# Patient Record
Sex: Male | Born: 1960 | Race: White | Hispanic: No | Smoking: Former smoker
Health system: Southern US, Community
[De-identification: ages and names within clinical notes are randomized; demographics above are authoritative.]

## PROBLEM LIST (undated history)

## (undated) DIAGNOSIS — I4891 Unspecified atrial fibrillation: Secondary | ICD-10-CM

## (undated) DIAGNOSIS — I471 Supraventricular tachycardia, unspecified: Secondary | ICD-10-CM

## (undated) DIAGNOSIS — I456 Pre-excitation syndrome: Secondary | ICD-10-CM

## (undated) DIAGNOSIS — G4733 Obstructive sleep apnea (adult) (pediatric): Secondary | ICD-10-CM

## (undated) HISTORY — PX: OTHER SURGICAL HISTORY: SHX169

## (undated) HISTORY — PX: TONSILLECTOMY: SUR1361

---

## 1997-10-30 ENCOUNTER — Ambulatory Visit: Admission: RE | Admit: 1997-10-30 | Discharge: 1997-10-30 | Payer: Self-pay | Admitting: Endocrinology

## 1998-04-02 ENCOUNTER — Inpatient Hospital Stay: Admission: RE | Admit: 1998-04-02 | Discharge: 1998-04-04 | Payer: Self-pay | Admitting: Otolaryngology

## 2005-04-23 ENCOUNTER — Ambulatory Visit: Payer: Self-pay | Admitting: Internal Medicine

## 2005-04-23 ENCOUNTER — Encounter (INDEPENDENT_AMBULATORY_CARE_PROVIDER_SITE_OTHER): Payer: Self-pay | Admitting: Interventional Cardiology

## 2005-04-24 ENCOUNTER — Inpatient Hospital Stay (HOSPITAL_COMMUNITY): Admission: EM | Admit: 2005-04-24 | Discharge: 2005-04-28 | Payer: Self-pay | Admitting: Emergency Medicine

## 2005-06-04 ENCOUNTER — Ambulatory Visit: Payer: Self-pay | Admitting: Internal Medicine

## 2006-04-21 ENCOUNTER — Ambulatory Visit: Payer: Self-pay | Admitting: Internal Medicine

## 2006-05-17 ENCOUNTER — Ambulatory Visit: Payer: Self-pay | Admitting: Internal Medicine

## 2006-05-17 ENCOUNTER — Inpatient Hospital Stay (HOSPITAL_COMMUNITY): Admission: EM | Admit: 2006-05-17 | Discharge: 2006-05-18 | Payer: Self-pay | Admitting: Emergency Medicine

## 2006-06-13 ENCOUNTER — Ambulatory Visit: Payer: Self-pay | Admitting: Internal Medicine

## 2006-08-02 ENCOUNTER — Inpatient Hospital Stay (HOSPITAL_COMMUNITY): Admission: EM | Admit: 2006-08-02 | Discharge: 2006-08-03 | Payer: Self-pay | Admitting: Emergency Medicine

## 2006-08-02 ENCOUNTER — Ambulatory Visit: Payer: Self-pay | Admitting: Internal Medicine

## 2007-01-12 IMAGING — CT CT HEART WO/W CTA ONLY W/ CA
1 of 2 series · 5 of 20 positions shown, 7 images · IV contrast (APPLIED)
Comparison: none

CLINICAL DATA: Mr. Virgelina is a 44-year-old patient of Dr. Fuseini Mohammed Galley, who presented to the hospital with a wide complex tachycardia and atrial fibrillation.  EP study is suggested.  An accessory tract accessible near the coronary sinus.  However, after multiple ablation attempts, the arrhythmia was not able to be eradicated.  Dr. Bambucafe wanted cardiac CT to further define coronary sinus anatomy. 
CTA CORONARY WITH CALCIUM SCORE:
PROTOCOL: The patient was scanned using a Siemens Sensation 64-slice scanner.  Gantry rotation speed was 330 milliseconds.  Due to patient size and heart rate, we elected to collimate at 1 mm with .5-mm overlap.  The patient was given p.o. beta blockers on the floor.  He was recently cardioverted with Rythmol.  The average heart rate during the case was 74 beats/minute.  
After initial topogram, a 3x3-mm calcium score was performed without contrast.  The patient then received 20 cc of contrast for timing bolus.  We elected to add 5 seconds to the peak aortic density curve to allow extra time for the coronary sinus to fill. 
Subsequently, 100 cc of contrast was used for the coronary sinus and CT angiogram.

[Series 9: abd 3s · axial · 0.86mm/px · z∈[-300,-192]mm · 5 of 55 slices shown, 7 images]
[im 10/55  vessel]
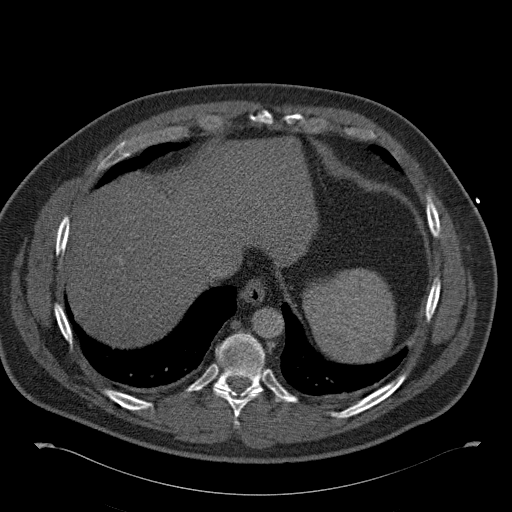
[im 10/55  lung]
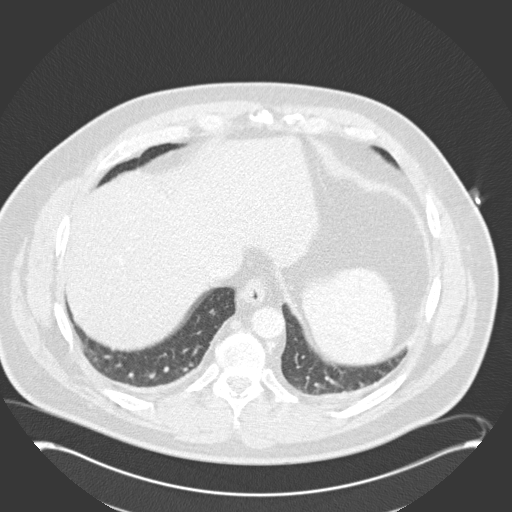
[im 19/55  vessel]
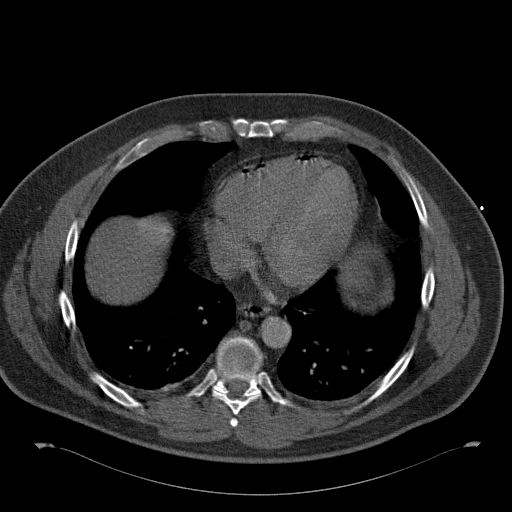
[im 28/55  vessel]
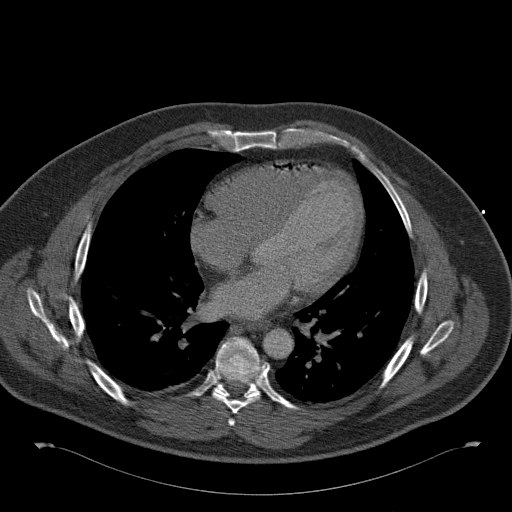
[im 37/55  vessel]
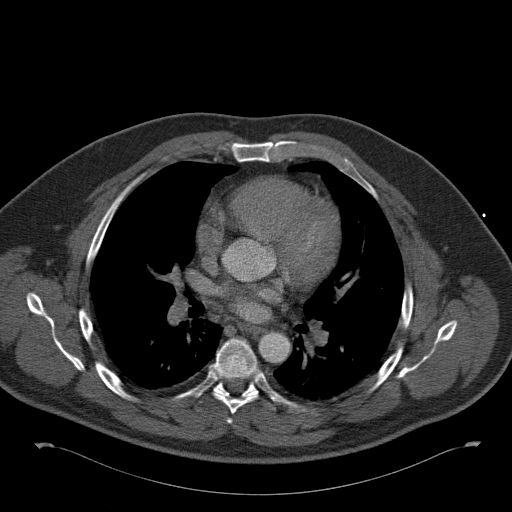
[im 46/55  vessel]
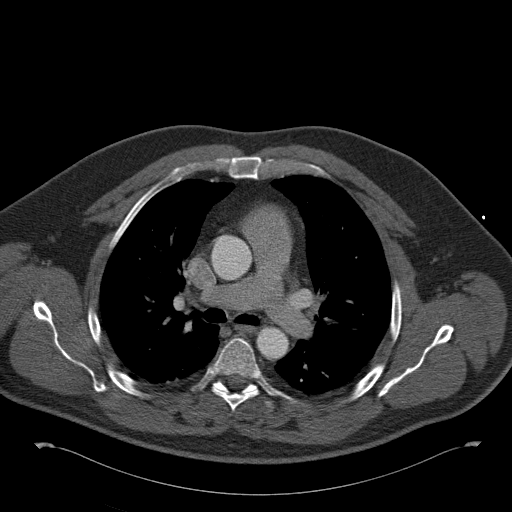
[im 46/55  lung]
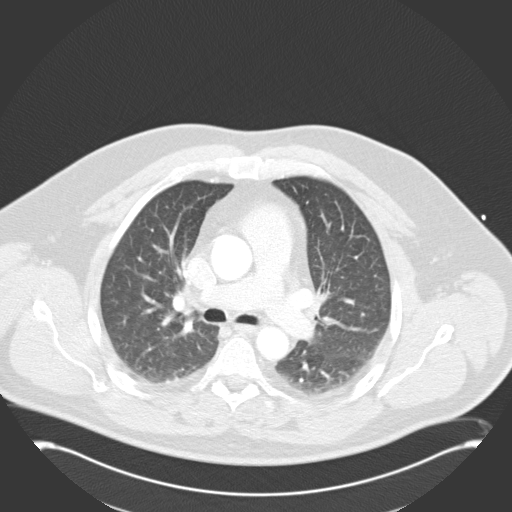

[5 of 20 positions shown; findings below may reference images not displayed]

FINDINGS: The coronary sinus arose from its normal position just superior to the septal leaflet of the tricuspid valve.  There was a large middle (posterior intraventricular vein), which took off from the coronary sinus right at its ostium.  It made approximately a 95-degree angle from the main body of the coronary sinus.  The great cardiac vein then proceeded in its normal course in the atrioventricular sulcus.  There was one large posteromedial vein seen.  The great cardiac vein then proceeded anteriorly into a large anterolateral vein, which ran side by side the LAD and actually crossed it for a small time. 
Of note, there was no prominent thebesian valve, and there was no evidence of the diverticula or any other abnormal structure emanating from the coronary sinus. 
The patient?s calcium score was 0.
Visually, LV function was normal.  We had difficulty quantitating the ejection fraction due to poor opacification of the RV relative to the LV.
The patient?s left atrium and right-sided cardiac chambers were also of normal size.  There was a trileaflet aortic valve, and the pulmonary veins arose normally from the posterior aspect of the left atrium.  There was no pericardial effusion, and the surrounding pericardiac structures appeared normal. 
Coronary CTA again was somewhat suboptimal.  Due to the timing, we chose to optimize the coronary sinus.
The left main coronary artery was normal. 
The left anterior descending artery was normal.  There were two small diagonal branches and an intermediate branch.  The circumflex coronary artery was difficult to see due to the prominence of the coronary sinus; however, it appeared small, primarily consisting of a small AV groove branch. 
The right coronary artery was dominant and normal with a simple PDA and posterolateral branch. 
Evaluation of extra-cardiac structures demonstrates mildly prominent mediastinal and right hilar nodal tissue.  Neither station is pathologic by CT size criteria, and findings are most likely reactive.  There is lingular atelectasis or scar.
IMPRESSION: 1.  Normal anatomy to the coronary sinus.  Large middle or posterior intraventricular vein coming off at the ostium of the coronary sinus at a 95-degree angle, most likely unable to be selectively cannulated at the time of EP study.  Large posteromedial vein and large anterolateral vein also emanating normally from the great cardiac vein. 
2.  No abnormal coronary sinus pathology or diverticula.  The thebesian vein is not particularly prominent. 
3.  Normal left ventricular cavity size and function. 
4.  Calcium score of 0. 
5.  No apparent coronary artery anomalies.  Circumflex coronary artery somewhat suboptimally seen due to prominence of coronary sinus visualization.
6.  The patient tolerated the procedure well. 
The study should be billed by [facility] as 5396-T and 015-T codes, done in conjunction with Dr. Telmo Gilpin from [REDACTED].

## 2010-05-10 ENCOUNTER — Encounter: Payer: Self-pay | Admitting: Interventional Cardiology

## 2010-09-04 NOTE — H&P (Signed)
NAMEJYDEN, [name]            ACCOUNT NO.:  [id number]   MEDICAL RECORD NO.:  [id number]          PATIENT TYPE:  EMS   LOCATION:  MAJO                         FACILITY:  [facility]   PHYSICIAN:  Francisca December, M.D.  DATE OF BIRTH:  April 18, 1961   DATE OF ADMISSION:  04/23/2005  DATE OF DISCHARGE:                                HISTORY & PHYSICAL   `   HISTORY OF PRESENT ILLNESS:  Sean [name] is a 50 year old white man  who is admitted to [facility] with a new onset of atrial  fibrillation with a rapid ventricular rate.   The patient, who has no past history of cardiac disease, was awakened during  the night with a sensation of a rapid and irregular heart beat.  There was  no associated chest pain, tightness, heaviness, pressure, or squeezing, nor  was there any dizziness, lightheadedness, near-syncope or dyspnea.  He  presented to the emergency department, where he was found to be in atrial  fibrillation with a ventricular rate of 120-130 beats per minute.  Treatment  was initiated with intravenous diltiazem and intravenous metoprolol.   As noted, the patient has no past history of cardiac disease, including no  history of chest pain, myocardial infarction, coronary artery disease,  congestive heart failure, or arrhythmia.  He has no risk factors for  coronary artery disease including no history of hypertension, dyslipidemia,  diabetes mellitus, smoking, or family history of early coronary artery  disease.  Both of his parents, however, have atrial fibrillation.   The patient has no other medical problems.  He has lost approximately 20  pounds recently after embarking on an exercise program using an elliptical  __________.   MEDICATIONS:  None.   ALLERGIES:  None.   OPERATIONS:  Surgery for sleep apnea.   SOCIAL HISTORY:  The patient is a Naval architect.  He is single.  He neither  smokes cigarettes nor drinks alcohol.   FAMILY HISTORY:  As described  above.   REVIEW OF SYSTEMS:  Review of systems reveals no new problems related to his  head, eyes, ears, nose, mouth, throat, lungs, gastrointestinal system,  genitourinary system, or extremities.  There is no history of neurologic or  psychiatric disorder.  There is no history of fever, chills or weight loss.   PHYSICAL EXAMINATION:  VITAL SIGNS:  Blood pressure 96/64.  Pulse 130 and  irregularly irregular.  Respirations 20.  Temperature 98.2.  GENERAL:  The patient was an obese, middle-aged white man in no discomfort.  He was alert, oriented, appropriate, and responsive.  HEENT:  Head, eyes, nose, and mouth were normal.  NECK:  The neck was without thyromegaly or adenopathy.  Carotid pulses were  palpable bilaterally and without bruits.  CARDIAC:  Examination revealed an irregularly irregular rhythm.  There was  no murmur, rub, gallop or click.  No chest wall tenderness was noted.  LUNGS:  The lungs were clear.  ABDOMEN:  The abdomen was soft and nontender.  There was no mass,  hepatosplenomegaly, bruit, distention, rebound, guarding, or rigidity.  Bowel sounds were normal.  RECTAL AND GENITAL:  Examinations were not performed as they were not  pertinent to the reason for acute care hospitalization.  EXTREMITIES:  The extremities were without edema, deviation, or deformity.  Radial and dorsalis pedal pulses were palpable bilaterally.  NEUROLOGIC:  Brief screening neurologic survey was unremarkable.   LABORATORY AND ACCESSORY CLINICAL DATA:  The electrocardiogram demonstrates  evidence of atrial fibrillation with a rapid ventricular rate (133 beats per  minute) and aberrantly conducted complexes.  The chest radiograph had not  yet been performed at the time of this dictation.  The initial set of  cardiac markers revealed a myoglobin of 45.3, CK-MB less than 1.0 and  troponin less than 0.05.  Potassium was 4.1, BUN 20, and creatinine 1.1.  White count was 10.1 with a hemoglobin of  14.6 and hematocrit of 42.3.  The  remaining studies were pending at the time of this dictation.   IMPRESSION:  New-onset atrial fibrillation with rapid ventricular rate.   PLAN:  1.  Telemetry.  2.  Serial cardiac enzymes.  3.  Aspirin.  4.  Intravenous heparin.  5.  Intravenous diltiazem.  6.  Echocardiogram.  7.  Thyroid function tests.  8.  Fasting lipid profile.  9.  Further measures per Dr. Amil Amen.      [name], MD  Electronically Signed      Francisca December, M.D.  Electronically Signed    MSC/MEDQ  D:  04/23/2005  T:  04/23/2005  Job:  [id number]

## 2010-09-04 NOTE — Discharge Summary (Signed)
NAMEREINHART, SAULTERS            ACCOUNT NO.:  [id number]   MEDICAL RECORD NO.:  [id number]          PATIENT TYPE:  INP   LOCATION:  6526                         FACILITY:  [facility]   PHYSICIAN:  Rollene Rotunda, MD, FACCDATE OF BIRTH:  [birth year]   DATE OF ADMISSION:  08/02/2006  DATE OF DISCHARGE:  08/03/2006                               DISCHARGE SUMMARY   ALLERGIES:  The patient has no known drug allergies.   TELEPHONE NUMBER:  [phone].   COMMENT:  This dictation less than 25 minutes.   PRINCIPAL DIAGNOSES:  1. Admitted with recurrent atrial fibrillation/rapid ventricular rate      (in 105s ).      a.     Largely asymptomatic with no chest pain, no dyspnea, no       palpitations.  2. History of atrial fibrillation.      a.     This is the third outbreak of his atrial fibrillation.      b.     Prior 2 outbreaks broke under intravenous procainamide.      c.     Resistance intravenous procainamide this admission, probably       secondary to slow 4-mg-per-minute administration rate (the patient       needs 17-mg-per-minute administration rate to be effective).  3. 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  4. Electrophysiology study, April 28, 2006, multiple ablation was      placed with finding of left epicardial posteroseptal accessory      pathway.  5. Referral to [place]/Dr. Fitzgerald/pulmonary vein procedure      contemplated.   SECONDARY DIAGNOSES:  Obstructive sleep apnea, status post oropharyngeal  plasty surgery.   PROCEDURE:  August 03, 2006, direct-current cardioversion under  anesthesia support, Dr. Lewayne Bunting.  The patient converted from fast  atrial fibrillation to sinus rhythm with a 200-joules biphasic shock,  Dr. Lewayne Bunting.   BRIEF HISTORY:  Mr. 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  He woke up at  2:30 the morning of  April 15 with his heart racing.  He knows when he  returns to a recurrent atrial fibrillation.   He drove himself to the emergency room.  His heart rate on EKG was 150  beats per minute.  He is fairly asymptomatic, no chest pain, no dyspnea,  no palpitation, no diaphoresis.   He has had 2 episodes prior to this spaced a little over 1 year apart.  Now this one was follows his last admission in late January 2008 by 3  months.   This dysrhythmia has proven resistant to flecainide, resistant to IV  metoprolol.  The patient had an electrophysiology study and attempted  radiofrequency catheter ablation of atrial fibrillation, April 28, 2005 with the finding of left epicardial posteroseptal accessory  pathway.  On admission, with recurrence May 17, 2006, he converted  to sinus rhythm on IV procainamide.  He is currently receiving 1 g of IV  procainamide.  He has been in a stable rhythm  since his last  hospitalization, January 2008.  He saw Dr. Ladona Ridgel in the office,  June 13, 2006.  The plan is if recurrence, a repeat attempted  ablation might be indicated; the patient says, I would like to get  fixed or die trying.   It is also possible the patient would be referred for ablation attempt  at another institution.   HOSPITAL COURSE:  The patient presented August 02, 2006 after waking in  atrial fibrillation.  He says he can tell when he has returned to his  irregular rhythm.  He came to the emergency room and on EKG was found to  be in atrial fibrillation with rapid ventricular rate with heart rate in  the 150s.  He was started on IV procainamide at 2 mg per minute; this  was subsequently increased to 4 mg per minute; however, he did not  convert after 1 gram of IV procainamide.  He has been additionally given  IV metoprolol and oral metoprolol in an effort to keep his heart rate at  least in the 120s.  He has been semi-successful at this overnight with  procainamide continued to  be running at 2 mg per minute.  He was seen by  Dr. Graciela Husbands, who recommended DCCV if the patient had not converted  spontaneously by the morning of April 16.  The patient had not converted  spontaneously and so cardioversion was done at about 4 o'clock by Dr.  Lewayne Bunting.  The patient successfully converted to sinus rhythm.  He  goes home on no medications except for a baby aspirin with followup with  Dr. Ladona Ridgel, who will arrange referral with Dr. Sampson Goon at [facility].  The patient's phone number will be faxed to the office so that  Dr. Lubertha Basque office can call the patient regarding future plans.  The  patient's telephone number is area code [phone].   LABORATORY STUDIES THIS ADMISSION:  Hemoglobin is 15.9, hematocrit 45.7,  white cell count 10.6, platelets of 216,000.  Creatinine is 1.2.  The  patient had been started on heparin this admission and was 0.53 on the  morning of April 16.  Troponin I studies were 0.01 then 0.01 and a TSH  was obtained; it was 1.454.      Maple Mirza, PA      Rollene Rotunda, MD, [facility]  Electronically Signed    GM/MEDQ  D:  08/03/2006  T:  08/04/2006  Job:  [id number]   cc:   Doylene Canning. Ladona Ridgel, MD

## 2010-09-04 NOTE — Op Note (Signed)
NAMEJERL, MUNYAN            ACCOUNT NO.:  [id number]   MEDICAL RECORD NO.:  [id number]          PATIENT TYPE:  INP   LOCATION:  2905                         FACILITY:  [facility]   PHYSICIAN:  Doylene Canning. Ladona Ridgel, M.D.  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   HISTORY OF PRESENT ILLNESS:  The patient is a 50 year old man with no  history of tachy palpitations who was admitted to the hospital with atrial  fibrillation and rapid ventricular response. He has intermittent pre-  excitation during A fib with the closest couple pre-excite interval of  approximately 230-240 milliseconds. He is now referred for  electrophysiologic study and possible catheter ablation.   PROCEDURE:  After informed consent was obtained, the patient taken to the  diagnostic EP lab in fasting state. After usual preparation and draping, 6-  French flexible catheter was inserted percutaneously into the right jugular  vein advanced to coronary sinus. A 5-French quadripolar catheter was  inserted percutaneously in the right femoral vein and advanced the His  bundle. Rapid ventricular pacing was carried out from the RV apex at paced  cycle length of 500 milliseconds and stepwise decreased down 310  milliseconds where the retrograde pathway ERP was observed. During rapid  ventricular pacemaker activation was eccentric and non decremental. The  earliest atrial activation was in the region near the os of the coronary  sinus. Next programmed ventricular stimulation was carried out from the RV  apex at base drive cycle length of 952 milliseconds. The S1-S2 interval  stepwise decreased down to 300 milliseconds where the retrograde AV node ERP  was observed. During  programmed ventricular stimulation the atrial  activation was non decremental and eccentric. Next rapid atrial pacing was  carried out from the coronary sinus as well as from the right atrium at  paced cycle lengths of 500 milliseconds and stepwise decreased down to 320  milliseconds where AV Wenckebach was observed. During rapid during rapid  atrial pacing, the patient was pre-excited down to 320 milliseconds where  the antegrade pathway ERP was observed. Additional pacing resulted in the  initiation of SVT (orthodromic AVRT).  Next programmed atrial stimulation  was carried out from the coronary sinus in high right atrium base drive  cycle length 841 milliseconds.  The S1-S2 interval was stepwise decreased  down to 340 milliseconds where the AV node ERP was observed. During  programmed atrial stimulation there were no AH jumps nor were there echo  beats. On the beat prior to the ERP at the 500/350 antegrade pathway ERP was  observed. Next additional rapid atrial pacing was carried out with result in  initiation of the patient's SVT. This was AV reentry tachycardia with  eccentric atrial activation the earliest of which was in the os of his  coronary sinus region to region at approximately 6 o'clock on the mitral  valve annulus. The  tachycardia would terminate spontaneously. PVCs could not  be placed secondary to spontaneous termination of the tachycardia. 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 Of note, the tachycardia typically terminated with a block in the  AV node. At this point a 7-French quadripolar ablation catheter was inserted  into the right femoral vein and mapping of the right posteroseptal space was  carried out. Several RF energy applications were delivered here and the VA  time was quite short. This was carried out during ventricular pacing.   Unfortunately the tachycardia did not terminate. Next, mapping was  subsequently carried out in the mid anteroseptal and the [place] time actually  had prolonged. Several RF energy applications were given right on the roof  of the CS on the anterior lip of the CS and the area just anterior to the CS  between the tricuspid valve annulus and in fact this will region was ablated  but there is no termination or resolution of accessory pathway conduction.  At this point the ablation catheter was maneuvered into the coronary sinus  and mapping was carried out in the coronary sinus. Several RF energy  applications were also delivered in the os of the coronary sinus and out to  approximately 5 o'clock on the mitral valve annulus. Unfortunately, however,  there is no effect on accessory pathway conduction. At this point the right  femoral artery was punctured and a 7-French quadripolar catheter was  inserted into the right femoral artery and advanced into the left ventricle  retrograde across the aortic valve and mapping of the left posteroseptal  space was carried out.  Again very early [place] times were found during mapping  with ventricular pacing and RF energy application applied to the left  posteroseptal space along the atrial ventricular insertions of the annulus  were carried out but again there was no termination of the patient's  tachycardia. At this point venography of the coronary sinus was carried out  utilizing a standard 6-French sheath and an angioplasty guidewire. This  demonstrated a paucity of vein. There was a nice posterolateral vein but no  diverticulum was seen. The middle cardiac vein was not visualized as well.  This raised the question of perhaps it having a separate os but this was not  ever cannulated. At this point the ablation catheter was repositioned within  the coronary sinus and mapping and ablating carried out at multiple sites along the coronary sinus but there was no  resolution of accessory pathway  conduction. At this point after over four hours of procedural time the  procedure was terminated, the catheters were removed, hemostasis was assured  and the patient was returned to his room in satisfactory condition.   COMPLICATIONS:  There were no immediate procedure complications.   RESULTS:  A. Baseline ECG. The baseline ECG demonstrates sinus rhythm with a  sinus node cycle length of 1051 milliseconds.  PR interval was 90  milliseconds. QRS duration 100 milliseconds.  The HV interval was 30  milliseconds. During pacing from the coronary sinus the HV interval  essentially was 0.  Actually the HV interval was less than 0.  C. Rapid ventricular pacing. Rapid ventricular pacing was carried out the RV  apex demonstrating a VA Wenckebach cycle length of 310 milliseconds. During  rapid ventricular pacing, the atrial activation was eccentric and non  decremental.  D.  Programmed ventricular  stimulation. Programmed ventricular stimulation  was carried out the RV apex at base drive cycle length of 161 milliseconds.  The S1-S2 interval was stepwise decreased down to 300 milliseconds where the  retrograde AV node ERP was observed. During programmed ventricular  stimulation the atrial activation was eccentric and nondecremental.  E.  Rapid atrial pacing. Rapid pacing was carried out the coronary sinus in  the high right atrium.  Pace cycle length of 600 milliseconds was stepwise  decreased down to 320 milliseconds where AV Wenckebach was observed. During  rapid atrial pacing, the PR interval actually shortened with marked manifest  pre-excitation down to 320 milliseconds where the QRS narrowed, antegrade  pathway blocked and tachycardia ensued.  F. Programmed atrial stimulation. Programmed atrial stimulation was carried  out from the coronary sinus. The high right atrium at base drive cycle  length of 096 milliseconds. The S1-S2 interval was stepwise decreased  from  440 milliseconds down to 340 milliseconds with the AV node ERP was observed.  During programmed atrial stimulation there was no inducible SVT.  G.  Arrhythmias observed.  1.  AV reentrant tachycardia, initiation rapid atrial pacing, duration      nonsustained, termination was spontaneous, cycle length 328      milliseconds.      1.  Mapping. Mapping of the patient's tachycardia demonstrated the          earliest atrial activation in the right posteroseptal space as well          as the coronary sinus os and out to a region at approximately 5          o'clock on the coronary sinus os. Of note during tachycardia in          during rapid ventricular pacing there were multiple early sites          throughout this posteroseptal space region.          1.  RF energy application. A total of 18 RF energy applications were              delivered both to the endocardial insertion (right posteroseptal             space and left posteroseptal space as well as the epicardial              region within the coronary sinus. The middle cardiac vein could              not be cannulated and was not visualized with venography in this              particular patient. This raises the question of a separate os in              the middle cardiac vein. RF energy application, both within the              coronary sinus and in the right posteroseptal space near the              coronary sinus anterior lip as well as on the left posteroseptal              space along both the atrial and ventricular insertions of the              mitral annulus failed to terminate accessory pathway conduction.   RECOMMENDATIONS:  The patient will be treated with Rythmol initially and  allow time for reconsideration for additional catheter ablation.  ______________________________  Doylene Canning. Ladona Ridgel, M.D.     GWT/MEDQ  D:  04/28/2005  T:  04/28/2005  Job:  [id number]   cc:   Francisca December, M.D.  Fax: [phone]    Lyn Records, M.D.  Fax: [phone]

## 2010-09-04 NOTE — Discharge Summary (Signed)
Sean [name], [name]            ACCOUNT NO.:  [id number]   MEDICAL RECORD NO.:  [id number]          PATIENT TYPE:  INP   LOCATION:  6524                         FACILITY:  [facility]   PHYSICIAN:  Doylene Canning. Ladona Ridgel, MD    DATE OF BIRTH:  1960/10/11   DATE OF ADMISSION:  05/17/2006  DATE OF DISCHARGE:  05/18/2006                               DISCHARGE SUMMARY   NO KNOWN DRUG ALLERGIES.   PRINCIPAL DIAGNOSES:  1. Admitted with atrial fibrillation and rapid ventricular rate.      a.     Resistant to flecainide 200 mg.      b.     Resistant to intravenous metoprolol 5 mg times 2 given in       ED.      c.     Converted to atrial flutter after 500 mg intravenous       procainamide.      d.     Converted to sinus rhythm after 1 gram intravenous of       procainamide.      e.     On Lopressor 25 mg p.o. overnight.  2. Patient maintaining sinus rhythm after conversion with      procainamide.   SECONDARY DIAGNOSES:  1. Wolff-Parkinson-White with existence of posteroseptal epicardial      accessory pathway.  2. Status post attempted electrophysiology, attempted radiofrequency      catheter ablation April 28, 2005, with finding of left epicardial      posteroseptal accessory pathway.  3. Obstructive sleep apnea, status post oropharyngeal release surgery.   BRIEF HISTORY:  Sean [name] is a 50 year old male.  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  He woke up the  morning of January 29 feeling not right.  He denies chest pain,  dizziness, dyspnea or fatigue.  He went into his rec room where he keeps  his exercise equipment.  He took his heart rate, which was 140-150 beats  per minute.  He took a rescue flecainide 100 mg orally at about 5:45  that morning, it had no effect and he came to the emergency room.  Electrocardiogram in the ED showed narrow complex irregular tachycardia  consistent with atrial fibrillation.   Oddly enough, Sean [name] presented in  rapid atrial fibrillation just 1  year ago, January 2007.  He converted to sinus rhythm on IV  procainamide.  Electrocardiograms after conversion demonstrated short  PR, delta waves in leads I, III,  AVL.  He underwent a long 4-hour  electrophysiology study with attempted radiofrequency catheter ablation  April 28, 2005.  Ablations were carried out in both the right and left  posteroseptal spaces as well as sites in the coronary sinus.  In each  case, there was no termination of his tachycardia.  Conclusion was that  the accessory pathway was a left epicardial posteroseptal accessory  pathway.   The patient has done well actually since his discharge on May 15, 2005.  His last office visit was actually April 21, 2006, this year,  with Dr. Ladona Ridgel.  At that  time, his supraventricular tachycardia was  felt to be stable and there was no impediments to his exercise  tolerance.  Patient will be admitted today.  The plan will be to start  the patient on another dose of flecainide and see if he converts; if  not, he will be given Lopressor 5 mg IV with a repeat dose if heart rate  does not respond in 1 hour.   HOSPITAL COURSE:  The patient presented May 17, 2006, with atrial  fibrillation, rapid ventricular rate.  It did not respond to his initial  flecainide taken at home, 100 mg.  He was given another 100 mg in the  emergency room with no response, heart rates maintaining in 140s-150s.  He received 2 doses of IV metoprolol 5 mg times 2, and heart rate had  showed no appreciable change in his rate.  He was then started on IV  procainamide drip 1 gram over 1 hour.  After 500 mg, his irregular  rhythm had regularized to a 150-beat per minute atrial flutter.  After 1  gram was administered, the patient actually suddenly converted to a  sinus rhythm with a rate in the 70s.  Procainamide drip, which had been  planned at 2 mg per minute, was discontinued and the patient was  maintained  on Lopressor 25 mg overnight.  Patient has maintained sinus  rhythm in the next 16 hours after conversion and we will discharge the  morning of January 30.  He has actually been on heparin this  hospitalization, which has been discontinued.  He is achieving 96%  oxygen saturation on room air.  His temperature is 98.7.  He has had no  complaints and is in sinus rhythm.   Complete blood count:  White cells 9.6, hemoglobin 15.1, hematocrit  44.4, platelets 198.  Other studies were taken in the emergency room.  Serum electrolytes on admission:  Sodium 137, potassium 4.3, chloride  104, carbonate 24, BUN is 14, creatinine 0.96, and glucose 102.  Pro  time was 13.1, INR was 1.0, troponin I studies x1 was 0.02.  His TSH  this admission was 1.473.  This patient will go home on Toprol XL 25 mg  daily.  He has flecainide, which he carries with him to take as rescue  if rapid rhythm returns.  He is to follow up with [facility]  [address] to see Dr. Ladona Ridgel Monday, June 13, 2006, at  2:45 p.m.  At that time, discussion will turn toward a second attempt at  radiofrequency ablation, either at [place] or in [place].      Maple Mirza, PA      Doylene Canning. Ladona Ridgel, MD  Electronically Signed    GM/MEDQ  D:  05/18/2006  T:  05/18/2006  Job:  [id number]

## 2010-09-04 NOTE — Consult Note (Signed)
Sean [name], Sean [name]            ACCOUNT NO.:  [id number]   MEDICAL RECORD NO.:  [id number]          PATIENT TYPE:  OBV   LOCATION:  3702                         FACILITY:  [facility]   PHYSICIAN:  Duke Salvia, M.D.  DATE OF BIRTH:  12/23/1960   DATE OF CONSULTATION:  04/23/2005  DATE OF DISCHARGE:                                   CONSULTATION   Thank you very much for asking [place] to see Sean [name] in  electrophysiological consultation for atrial fibrillation with wide complex  with possible pre-excitation.   Sean [name] is a 50 year old recently divorced gentleman who has  obstructive sleep apnea status UPPP and nasal septoplasty who was awakened  on the night of January 4 - January 5 with a startle and sensation that his  heart was not quite right.  He hooked himself up to the elliptical that he  had bought to get in shape after he separated from his wife and noted that  his heart rate was 140 to 150.  Retrospectively, he had noted that his heart  rate was in the normal range earlier on the a.m. of January 4 at a rate of  about 70.  Because of his palpitations and some associated discomfort in his  chest, he presented to the emergency room where he was found to be in an  irregular wide complex rhythm that was felt to be atrial fibrillation with  aberration.  He was treated with beta blockers, Cardizem, and heparin, and  admitted.   This morning after review, Dr. Katrinka Blazing astutely appreciated that, in fact, the  electrocardiogram might represent pre-excitation.  The patient has not had a  prior history of tachy palpitations; he has had no history of syncope.   His past medical history is notable for questionable:  1.  Borderline hypertension.  2.  Obstructive sleep apnea status post surgery as noted previously.  3.  Obesity with a weight of about 295 to 305 pounds.   His past surgical history is as noted previously.   SOCIAL HISTORY:  He is divorced, I don't recall  whether he has children.  He  is an over the road truck driver.  He uses caffeine.  He denies the use of  cigarettes, alcohol, or recreational drugs.   CURRENT MEDICATIONS:  Heparin, aspirin, Cardizem, Lopressor x 1 single dose,  and now Amiodarone.   ALLERGIES:  He has no known drug allergies.   PHYSICAL EXAMINATION:  GENERAL: Obese Caucasian male appearing his stated age of 50.  VITAL SIGNS:  Blood pressure 112/86, pulse 110 to 130, weight 312 pounds,  respirations 20.  HEENT:  No icterus or xanthoma.  The neck veins were flat.  The carotids  were brisk and full bilaterally without bruits.  BACK:  Without kyphosis or scoliosis.  LUNGS:  Clear.  HEART:  Irregular without murmurs or gallops.  ABDOMEN:  Soft, with active bowel sounds.  EXTREMITIES:  Femoral pulses were 1+, distal pulses were intact, there is no  cyanosis, clubbing, and edema.  NEUROLOGICAL:  Grossly normal.   Electrocardiogram dated this morning demonstrated atrial fibrillation with a  rate of 150 beats per minute.  There are wide complex beats that are not  long-short related.  The negative delta waves are noted in leads 2, 3, and F  with a transition to positive in lead V1 and V2, consistent with a posterior  reciprocal pathway likely within the coronary sinus based on the [place]  observations.   IMPRESSION:  1.  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  2.  Not low risk for sudden cardiac death given an RR coupling interval of      230 milliseconds.  3.  History of sleep apnea status post UPPP with no further symptoms.  4.  Borderline blood pressure.  5.  Obesity.   DISCUSSION:  [name] has pre-excited atrial fibrillation and is not at  low risk for sudden cardiac death based on the close couplings of his RR  intervals.  We have discussed treatment options, all of which are predicated  on getting him back into sinus rhythm.  Given the onset of his episode  on  the evening of April 22, 2005, and having a known heart rate on the morning  of January 4, I would like to get him cardioverted ASAP and this is  scheduled tomorrow morning at 8 a.m.  I have reviewed this with the  anesthesiologist and Dr. Armanda Magic.   Once he is in sinus rhythm, his anticoagulation can be discontinued and EP  study and RF catheter ablation is available.  We have discussed treatment  options including beta blockers, drug therapy, and EP study and RF catheter  ablation.  We have discussed the potential benefits as well as the potential  risks including but not limited to death, perforation, myocardial  infarction, and stroke.  He understands these risks.  He understands that  the risks may be somewhat greater than this, also, maybe by a factor of 2 or  3 based on the presence of the pathway moving through the coronary sinus and  also because of the projected location of the pathway that I don't want him  on anticoagulation at the time of the procedure.  Based on the above and  based on his risks of sudden death related to his pre-excited atrial  fibrillation, he would like to proceed with the procedure with the hopes of  reducing his overall risks as well as minimizing the likelihood of  medications and recurrences.  It would also be quite important, I suspect,  for his DOT eligibility.   Based on the above, we will therefore:  1.  Proceed with cardioversion in the morning.  2.  Anticipate RF catheter ablation and EP testing on Monday.   Thank you for the consultation.           ______________________________  Duke Salvia, M.D.     SCK/MEDQ  D:  04/23/2005  T:  04/23/2005  Job:  [id number]   cc:   Lyn Records, M.D.  Fax: [phone]

## 2010-09-04 NOTE — Op Note (Signed)
[name], Sean            ACCOUNT NO.:  [id number]   MEDICAL RECORD NO.:  [id number]          PATIENT TYPE:  INP   LOCATION:  6526                         FACILITY:  [facility]   PHYSICIAN:  Doylene Canning. Ladona Ridgel, MD    DATE OF BIRTH:  Jun 22, 1960   DATE OF PROCEDURE:  08/03/2006  DATE OF DISCHARGE:  08/03/2006                               OPERATIVE REPORT   PROCEDURE PERFORMED:  DC cardioversion.   INDICATIONS:  Atrial fibrillation with rapid ventricular response and  WPW syndrome.   The patient is a very pleasant 50 year old male with history of atrial  fibrillation and WPW syndrome and a posterolateral accessory pathway  (epicardial) who underwent attempted ablation over a year ago.  He has  had two episodes of atrial fibrillation since then with rapid  ventricular response.  He came to the hospital yesterday having gone  back in atrial fibrillation and was treated with IV procainamide.  He is  now referred for DC cardioversion.   PROCEDURE:  After informed obtained, the patient taken diagnostic EP lab  in the fasting state.  The patient was prepped in the usual manner.  He  was sedated under the direction anesthesia with sodium Pentothal.  He  was cardioverted with 200 joules of synchronized biphasic energy through  the electrode dispersement pads which were in the anterior-posterior  position.  Was subsequently restored sinus rhythm.  The patient  tolerated procedure well.  There were no immediate procedure  complications.   RESULTS:  This demonstrate successful DC cardioversion of a patient with  atrial fibrillation and rapid ventricular response.      Doylene Canning. Ladona Ridgel, MD  Electronically Signed     GWT/MEDQ  D:  08/03/2006  T:  08/04/2006  Job:  [id number]   cc:   Lyn Records, M.D.

## 2010-09-04 NOTE — Discharge Summary (Signed)
Sean [name], Sean [name]            ACCOUNT NO.:  [id number]   MEDICAL RECORD NO.:  [id number]          PATIENT TYPE:  INP   LOCATION:  2905                         FACILITY:  [facility]   PHYSICIAN:  Lyn Records, M.D.   DATE OF BIRTH:  [birth year]   DATE OF ADMISSION:  04/23/2005  DATE OF DISCHARGE:                                 DISCHARGE SUMMARY   REASON FOR ADMISSION:  Atrial fibrillation with rapid ventricular response.   DISCHARGE DIAGNOSES:  1.  Atrial fibrillation, resolved on intravenous procainamide.  2.  Wolff-Parkinson-White syndrome with antegrade conduction down the      accessory pathway.      1.  Initial electrophysiologic study demonstrate accessory pathway, but          it cannot be ablated.  3.  Borderline blood pressure elevation.   RECOMMENDATIONS:  1.  The patient will follow up with Dr. Ladona Ridgel concerning possible repeat EP      study with attempt at ablation.  2.  CT angio was performed on the day of discharge to help define the      coronary venous system with hopes that this will make it easy to perform      the ablation procedure in several weeks to a month or so.   MEDICATIONS AT DISCHARGE:  1.  Rythmol 225 mg p.o. b.i.d.  2.  Aspirin 325 mg p.o. daily.   PHYSICAL ACTIVITY:  No limitations.   RETURN TO WORK:  April 30, 2005.   FOLLOWUP:  The patient has a follow-up appointment set to see Dr. Ladona Ridgel  within the next two to three weeks. The patient has no follow-up appointment  with Dr. Katrinka Blazing and I will be seeing the patient after he has been through  his electrophysiologic therapy.   PROCEDURE PERFORMED:  1.  Electrophysiologic testing and attempted ablation on April 27, 2005, by      Dr. Lewayne Bunting.  2.  Coronary CTA and coronary venous evaluation on April 28, 2005.   CONDITION ON DISCHARGE:  Improved.   HOSPITAL COURSE AND COMMENTS:  The patient was admitted through the  emergency room after suddenly developing tachypalpitations on  April 23, 2005. He had been given IV beta blocker and IV Cardizem in the emergency  room without much reduction in heart rate and in fact as Cardizem therapy  was intensified he began having salvos of wide complex tachycardia at rates  near 200. The complexes appear to have a pre-excitation type of morphology.  Because of this clinical scenario I asked the EP service to see the patient  with concern being that an accessory pathway might be present.   Dr. Graciela Husbands saw the patient on the day of my request and instead of IV  amiodarone as I had ordered he actually gave the patient IV procainamide and  there was resolution of the arrhythmia and the post conversion EKG  subsequently revealed a pre-excitation electrocardiogram.   He ultimately was taken to the EP lab on April 27, 2005.  Please see  details of the report by Dr. Ladona Ridgel. They were unsuccessful at ablating  the  accessory pathway, but it appears that an additional attempt will likely  occur.   The patient is discharged from the hospital in improved condition on  Rythmol, aspirin, and with instructions to follow up with Dr. Ladona Ridgel for  further therapy.      Lyn Records, M.D.  Electronically Signed     HWS/MEDQ  D:  04/28/2005  T:  04/28/2005  Job:  [id number]   cc:   Duke Salvia, M.D.  [address]  [address]  [place]  [place] [address]   Doylene Canning. Ladona Ridgel, M.D.  [address]  [address]  [place]  [place] [address]

## 2010-09-04 NOTE — Assessment & Plan Note (Signed)
[facility]                         ELECTROPHYSIOLOGY OFFICE NOTE   YAMAN, GRAUBERGER                   MRN:          [id number]  DATE:04/21/2006                            DOB:          Aug 17, 1960    Mr. Bilodeau returns today for followup.  He is a very pleasant middle-  aged man with a history of WPW syndrome and SVT who underwent attempted  catheter ablation over a year ago.  At that time he was found to have an  epicardial posteroseptal accessory pathway with the tachycardia not  being clearly ablatable.  However, since then the patient has been well.  He has had no significant palpitations or rapid heart racing his  ablation procedure. Prior to this he noted that his symptoms would  mostly occur when he was brushing his teeth.  He decided to stop using  toothpaste after his ablation and now he uses peroxide and baking soda  and is convinced that this is what has reduced his symptoms of  palpitations.  He otherwise has been stable and he continues to exercise  very vigorously on a regular basis despite his advanced weight.   PHYSICAL EXAMINATION:  GENERAL:  He is a pleasant, obese, middle-aged  man in no acute distress.  VITAL SIGNS:  Blood pressure 130/87, pulse 76 and regular, respirations  18. Weight 311 pounds.  NECK:  Revealed no jugular venous distension.  LUNGS:  Clear bilaterally to auscultation.  No wheezes, rales or  rhonchi.  CARDIOVASCULAR:  Regular rate and rhythm with normal S1 and S2.  His  heart sounds are somewhat distant.  EXTREMITIES:  Demonstrate no cyanosis, clubbing or edema.   EKG sinus rhythm with a very soft, small delta wave which is negative in  II and III consistent with an epicardial accessory pathway.   IMPRESSION:  1. Wolff-Parkinson-White syndrome with palpitations.  2. Supraventricular tachycardia.  3. Status post ablation.   DISCUSSION:  Overall Mr. Catalfamo is stable.  His supraventricular  tachycardia is quiet after ablation.  Perhaps this represents a delayed  cure.  Will plan to see him back in the office in one year, sooner  should he have additional symptoms.     Doylene Canning. Ladona Ridgel, MD  Electronically Signed    GWT/MedQ  DD: 04/21/2006  DT: 04/21/2006  Job #: [id number]   cc:   Lyn Records, M.D.

## 2010-09-04 NOTE — Letter (Signed)
August 05, 2006    Clydie Braun, M.D.  [facility] [address]  [place], [place] [address]   RE:  WIN, GUAJARDO  MRN:  [id number]  /  DOB:  1961/03/04   Dear Onalee Hua:   This is a letter of introduction on a patient that I follow named  Sean [name].  He is a very pleasant middle-aged man who has a  history of WPW syndrome, and atrial fibrillation, which has been  paroxysmal.  When I first met Mr. Hamza, the patient had atrial  fibrillation with a rapid, but not particularly rapid, i.e., his rate  was under 100 beats per minute.  Ventricular response with variable  amounts of ventricular preexcitation.  He also underwent EP study and  catheter ablation of what was found out to be a left posteroseptal  accessory pathway.  The tachycardia was attempted to ablated with the  retrograde across the aortic valve approach, and we had significant  difficulty in reaching the area on the left posterior septum.  During  ablation, the patient would have transient termination of the  tachycardia, however, it would always come back within a few minutes.  Because of this, the question of whether had in fact an epicardial  pathway was entertained, and mapping was done inside the coronary sinus,  but we could never find early site in this location.  For this reason,  additional ablation was abandoned.  The patient did well for over a  year, and was asymptomatic on no medical therapy, but then developed  recurrent atrial fibrillation with variable amounts of preexcitation  about 4 months ago, and then had a recurrent episode several days  resulting in hospitalization and DC cardioversion.  Initially, the  patient, because of his very infrequent episodes of palpitations, was  not inclined to proceed with redo ablation.  However, because he has now  developed more and more atrial fibrillation, he is interested in  pursuing catheter ablation.  Of note, when he was in the hospital more  recently he had very little amounts of preexcitation, raising the  question of how much of atrial fibrillation is related to his accessory  pathway, and how much is related to intrinsic left atrial disease and  dysfunction.  I do not have a recent 2D echo on the patient.   As the patient is now considering proceeding with ablation, I have  recommended that he be seen by you for additional evaluation.  Mr.  Cueva' cell phone number is [phone].  His date of birth June 18, 1960.  Hopefully, he can be seen for evaluation.    Sincerely,      Doylene Canning. Ladona Ridgel, MD  Electronically Signed    GWT/MedQ  DD: 08/05/2006  DT: 08/06/2006  Job #: [id number]   CC:    Lyn Records, M.D.

## 2010-09-04 NOTE — H&P (Signed)
NAMEOSIRIS, ODRISCOLL            ACCOUNT NO.:  [id number]   MEDICAL RECORD NO.:  [id number]          PATIENT TYPE:  INP   LOCATION:  1831                         FACILITY:  [facility]   PHYSICIAN:  Doylene Canning. Ladona Ridgel, MD    DATE OF BIRTH:  [birth year]   DATE OF ADMISSION:  05/17/2006  DATE OF DISCHARGE:                              HISTORY & PHYSICAL   Dr. Lewayne Bunting is the electrophysiologist.   He has no known drug allergies.   Mr. Hixon is a 50 year old male with history of WPW and orthodromic  AVRT.  He woke up this morning feeling not right.  He denies chest  pain, dizziness, dyspnea or fatigue.  He went into his rec room where  exercise equipment is kept.  He took his heart rate, which was between  140 and 150 beats per minute.  He took a rescue flecainide 100 mg  orally at about 5:45 in the morning.  When it had no effect on his  rhythm, he came to the emergency room.   His electrocardiogram shows __________ complex irregular tachycardia  consistent with atrial fibrillation, rapid ventricular rate.  Oddly  enough, Mr. Bitner presented in rapid atrial fibrillation just 1 year  ago.  In the emergency room he converted to sinus rhythm on IV  procainamide.  Electrocardiograms after conversion demonstrated short  PR, delta waves in leads I, III, and aVL.  He underwent a long 4-hour  electrophysiology study with attempted ablation on April 28, 2005.  Ablations were carried out in both the right and left posteroseptal  spaces as well as sites in the coronary sinus.  In each case there was  no termination of his tachycardia.  The overall conclusion after  stopping the ablation proceeding was that the patient had a left  epicardial posteroseptal accessory pathway.  The patient has actually  done very well since discharge in January 2007.  His last office visit  was early this year, April 21, 2006, with Dr. Ladona Ridgel.  At that time his  SVT was felt to be stable in the post-ablation  period.  He had no  impediment to his exercise tolerance.   MEDICATIONS:  1. Flecainide 100 mg, take only as needed as a rescue medication.  2. Vitamin B12 tablets.  3. Multivitamin tablet.  4. Odorless garlic tablets.   No known drug allergies for this patient.   The patient lives in [place].  He lives alone.  He is a  Sales promotion account executive.  Does not smoke, does not partake of  alcoholic beverages or recreational drugs.   REVIEW OF SYSTEMS:  No fevers, chills, night sweats, weight change or  adenopathy.  HEENT:  The patient has occasional epistaxis.  He had  childhood epistaxis quite regularly but this has resolved and  diminished.  No voice change, no vertigo.  No photophobia.  INTEGUMENT:  No rashes, nonhealing ulcerations.  CARDIOPULMONARY:  No chest pain, no  shortness of breath, no orthopnea, no paroxysmal nocturnal dyspnea or  edema.  He has no history of presyncope or syncope.  He has the feeling  of not right when he  is in rapid rhythm that could be described as  palpitation.  UROGENITAL:  No dysuria, no frequency, no urgency.  NEUROPSYCHIATRIC:  No weakness, anxiety, depression.  GASTROINTESTINAL:  No GERD.  No history of GI bleeding.  ENDOCRINE:  No diabetes or thyroid  problems.  MUSCULOSKELETAL: Denies arthralgias or joint effusions or  deformities.  All other systems are negative.   PHYSICAL EXAMINATION:  GENERAL:  This patient is alert and oriented x3.  No acute distress.  VITAL SIGNS:  Temperature 98.3, pulse is 151, blood pressure 120/67,  respirations are 18, oxygen saturation 95% on room air.  HEENT:  Eyes:  Pupils equal, round, reactive to light.  Extraocular  movements are intact.  Nares are patent.  NECK:  Supple.  No carotid bruits auscultated.  No thyromegaly.  No  jugular venous distension.  HEART:  Irregular rate and rhythm which is very fast, 120-130 at exam.  LUNGS:  Clear to auscultation bilaterally.  ABDOMEN:  Mildly obese.  No  hepatosplenomegaly.  Abdominal aorta is  nonpulsatile.  No rebound or guarding.  EXTREMITIES: Dorsalis pedis pulses are 4/4 bilaterally.  Radial pulses  4/4 bilaterally.  No dependent edema.  MUSCULOSKELETAL:  No joint deformity, effusions or kyphosis.  NEUROLOGIC:  No deficits noted.  Cranial nerves II-XII grossly intact.   Electrocardiogram May 17, 2006:  Rate is 148, atrial fibrillation,  QRS of 100 msec.  There is some evidence of intermittent pre-excitation  in some of the QRS's.   LABORATORY STUDIES THIS ADMISSION:  Hemoglobin 16.3, hematocrit 48.6,  white cells 8.9, platelets 225.  Serum electrolytes:  Sodium 137,  potassium 4.3, chloride 105, carbonate 24, BUN is 14, creatinine 0.96,  glucose is 102.  The PT is 13.1, INR of 1.0, PTT is 3.  Troponin I  studies x1 is 0.02.  Serum electrolytes:  Calcium is nine.  TSH is  pending.   IMPRESSION:  1. Admit to emergency department and then to step-down unit.      Diagnosis:  Atrial fibrillation, rapid ventricular rate, mildly      symptomatic.  He has no chest pain, dyspnea, dizziness or fatigue.  2. History of Wolff-Parkinson-White syndrome.      a.     Admission April 23, 2005, atrial fibrillation, rapid       ventricular rate.      b.     Conversion on IV procainamide      c.     Electrophysiology study April 28, 2006:  Multiple       ablations with finding of left epicardial posteroseptal accessory       pathway.  3. History of obstructive sleep apnea, status post oropharyngeal      tissue plasty in 1999.   PLAN:  Give the patient additional flecainide 100 mg, also to start on  metoprolol 5 mg IV.  If the heart rate was not less than 120 in 1 hour,  the patient to get an additional 5 mg IV.  Through the day the patient's  heart rate has remained elevated and he was therefore subsequently  started on IV procainamide 1 g over 1 hour.  His heart rate became very regular, possibly in atrial flutter, rate is 150.  He was  then after the  bolus of procainamide started on a procainamide drip of 2 mg/min., and  he will be given 50 mg of Lopressor this evening orally.  He will be  admitted to a step-down bed and we are striving for rate  control.  IV  heparin has been started by the pharmacy.     Maple Mirza, PA      Doylene Canning. Ladona Ridgel, MD  Electronically Signed   GM/MEDQ  D:  05/17/2006  T:  05/18/2006  Job:  [id number]

## 2010-09-04 NOTE — Assessment & Plan Note (Signed)
[facility]                         ELECTROPHYSIOLOGY OFFICE NOTE   DAMAURI, MINION                   MRN:          [id number]  DATE:06/13/2006                            DOB:          30-Apr-1960    Mr. Embleton returns today for followup.  He is a very pleasant 50-year-  old male with a history of a left-sided accessory pathway in atrial  fibrillation.  The patient underwent electrophysiological study and  attempted catheter ablation over a year ago which was unsuccessful.  We  had difficulty mapping at that time and difficulty managing SVT.  The  patient has done well with one episode of atrial fibrillation and SVT  occurring several weeks ago requiring hospital stay and initiation of  procainamide which resulted in restoration of sinus rhythm.  The patient  returns today in followup.  He has been bothered with a flu-like illness  for the last several weeks.  This has improved finally.  He has had no  significant palpitations during this period.   PHYSICAL EXAMINATION:  GENERAL:  On exam, he is a pleasant, well-  appearing, middle-age man in no distress.  VITAL SIGNS: Blood pressure was 134/86, pulse 85 and regular,  respirations 18.  Weight was 305 pounds.  NECK:  No jugular venous distention.  LUNGS: Clear bilaterally to auscultation.  CARDIOVASCULAR: Regular rate and rhythm with normal S1 and S2.  EXTREMITIES:  Demonstrated no edema.   EKG demonstrates sinus rhythm with very minimal ventricular  preexcitation.   IMPRESSION:  1  Wolff-Parkinson-White syndrome.  1. Atrial fibrillation.  2. Status post attempted ablation with only one recurrence of atrial      fibrillation in the last year.   DISCUSSION:  I discussed treatment options with the patient in detail.  I spent a fair amount of time outlining the options for treatment which  include retrial of catheter ablation here versus referral to another  institution versus a period of  watchful waiting.  I recommended watchful  waiting for now, but if his SVT symptoms increase in frequency and  severity, then ablation would be warranted.     Doylene Canning. Ladona Ridgel, MD  Electronically Signed    GWT/MedQ  DD: 06/13/2006  DT: 06/13/2006  Job #: [id number]   cc:   Lyn Records, M.D.

## 2014-09-23 ENCOUNTER — Emergency Department (HOSPITAL_COMMUNITY): Payer: Self-pay

## 2014-09-23 ENCOUNTER — Inpatient Hospital Stay (HOSPITAL_COMMUNITY)
Admission: EM | Admit: 2014-09-23 | Discharge: 2014-09-25 | DRG: 309 | Disposition: A | Discharge status: Home / Self Care | Payer: Self-pay | Attending: Internal Medicine | Admitting: Internal Medicine

## 2014-09-23 ENCOUNTER — Encounter (HOSPITAL_COMMUNITY): Payer: Self-pay | Admitting: *Deleted

## 2014-09-23 DIAGNOSIS — R000 Tachycardia, unspecified: Secondary | ICD-10-CM

## 2014-09-23 DIAGNOSIS — I456 Pre-excitation syndrome: Secondary | ICD-10-CM | POA: Diagnosis present

## 2014-09-23 DIAGNOSIS — I480 Paroxysmal atrial fibrillation: Secondary | ICD-10-CM

## 2014-09-23 DIAGNOSIS — R109 Unspecified abdominal pain: Secondary | ICD-10-CM

## 2014-09-23 DIAGNOSIS — Z87891 Personal history of nicotine dependence: Secondary | ICD-10-CM

## 2014-09-23 DIAGNOSIS — Z8679 Personal history of other diseases of the circulatory system: Secondary | ICD-10-CM

## 2014-09-23 DIAGNOSIS — I4892 Unspecified atrial flutter: Principal | ICD-10-CM

## 2014-09-23 DIAGNOSIS — Z6841 Body Mass Index (BMI) 40.0 and over, adult: Secondary | ICD-10-CM

## 2014-09-23 DIAGNOSIS — G4733 Obstructive sleep apnea (adult) (pediatric): Secondary | ICD-10-CM | POA: Diagnosis present

## 2014-09-23 HISTORY — DX: Supraventricular tachycardia: I47.1

## 2014-09-23 HISTORY — DX: Unspecified atrial fibrillation: I48.91

## 2014-09-23 HISTORY — DX: Obstructive sleep apnea (adult) (pediatric): G47.33

## 2014-09-23 HISTORY — DX: Supraventricular tachycardia, unspecified: I47.10

## 2014-09-23 HISTORY — DX: Pre-excitation syndrome: I45.6

## 2014-09-23 LAB — CBC WITH DIFFERENTIAL/PLATELET
BASOS PCT: 0 % (ref 0–1)
Basophils Absolute: 0.1 10*3/uL (ref 0.0–0.1)
Eosinophils Absolute: 0.3 10*3/uL (ref 0.0–0.7)
Eosinophils Relative: 2 % (ref 0–5)
HEMATOCRIT: 42.9 % (ref 39.0–52.0)
Hemoglobin: 13.9 g/dL (ref 13.0–17.0)
Lymphocytes Relative: 19 % (ref 12–46)
Lymphs Abs: 2.6 10*3/uL (ref 0.7–4.0)
MCH: 27 pg (ref 26.0–34.0)
MCHC: 32.4 g/dL (ref 30.0–36.0)
MCV: 83.3 fL (ref 78.0–100.0)
MONOS PCT: 5 % (ref 3–12)
Monocytes Absolute: 0.7 10*3/uL (ref 0.1–1.0)
Neutro Abs: 10.1 10*3/uL — ABNORMAL HIGH (ref 1.7–7.7)
Neutrophils Relative %: 74 % (ref 43–77)
PLATELETS: 312 10*3/uL (ref 150–400)
RBC: 5.15 MIL/uL (ref 4.22–5.81)
RDW: 15.4 % (ref 11.5–15.5)
WBC: 13.7 10*3/uL — ABNORMAL HIGH (ref 4.0–10.5)

## 2014-09-23 LAB — COMPREHENSIVE METABOLIC PANEL
ALT: 31 U/L (ref 17–63)
ANION GAP: 9 (ref 5–15)
AST: 24 U/L (ref 15–41)
Albumin: 3.6 g/dL (ref 3.5–5.0)
Alkaline Phosphatase: 60 U/L (ref 38–126)
BUN: 18 mg/dL (ref 6–20)
CHLORIDE: 103 mmol/L (ref 101–111)
CO2: 27 mmol/L (ref 22–32)
Calcium: 9.2 mg/dL (ref 8.9–10.3)
Creatinine, Ser: 1.32 mg/dL — ABNORMAL HIGH (ref 0.61–1.24)
GFR calc Af Amer: >60 mL/min (ref >60)
GFR, EST NON AFRICAN AMERICAN: 60 mL/min — AB (ref >60)
GLUCOSE: 112 mg/dL — AB (ref 65–99)
POTASSIUM: 3.9 mmol/L (ref 3.5–5.1)
SODIUM: 139 mmol/L (ref 135–145)
TOTAL PROTEIN: 7.8 g/dL (ref 6.5–8.1)
Total Bilirubin: 0.3 mg/dL (ref 0.3–1.2)

## 2014-09-23 LAB — TSH: TSH: 2.355 u[IU]/mL (ref 0.350–4.500)

## 2014-09-23 LAB — LIPASE, BLOOD
LIPASE: 27 U/L (ref 22–51)
Lipase: 24 U/L (ref 22–51)

## 2014-09-23 LAB — AMYLASE: Amylase: 48 U/L (ref 28–100)

## 2014-09-23 LAB — TROPONIN I

## 2014-09-23 LAB — I-STAT TROPONIN, ED: TROPONIN I, POC: 0 ng/mL (ref 0.00–0.08)

## 2014-09-23 LAB — T4, FREE: FREE T4: 0.71 ng/dL (ref 0.61–1.12)

## 2014-09-23 MED ORDER — DIGOXIN 0.25 MG/ML IJ SOLN
0.2500 mg | Freq: Four times a day (QID) | INTRAMUSCULAR | Status: AC
  Administered 2014-09-24 (×2): 0.25 mg via INTRAVENOUS
  Filled 2014-09-23 (×5): qty 1

## 2014-09-23 MED ORDER — SODIUM CHLORIDE 0.9 % IV BOLUS (SEPSIS)
1000.0000 mL | Freq: Once | INTRAVENOUS | Status: AC
  Administered 2014-09-23: 1000 mL via INTRAVENOUS

## 2014-09-23 MED ORDER — DIGOXIN 0.25 MG/ML IJ SOLN
0.5000 mg | Freq: Once | INTRAMUSCULAR | Status: AC
  Administered 2014-09-23: 0.5 mg via INTRAVENOUS

## 2014-09-23 MED ORDER — DILTIAZEM LOAD VIA INFUSION
20.0000 mg | Freq: Once | INTRAVENOUS | Status: AC
  Administered 2014-09-23: 20 mg via INTRAVENOUS
  Filled 2014-09-23: qty 20

## 2014-09-23 MED ORDER — DILTIAZEM HCL 100 MG IV SOLR
5.0000 mg/h | INTRAVENOUS | Status: DC
  Administered 2014-09-23: 5 mg/h via INTRAVENOUS
  Administered 2014-09-23: 15 mg/h via INTRAVENOUS
  Administered 2014-09-24: 10 mg/h via INTRAVENOUS
  Administered 2014-09-24 (×2): 15 mg/h via INTRAVENOUS
  Administered 2014-09-25: 10 mg/h via INTRAVENOUS

## 2014-09-23 MED ORDER — RIVAROXABAN 20 MG PO TABS
20.0000 mg | ORAL_TABLET | Freq: Every day | ORAL | Status: DC
  Administered 2014-09-24: 20 mg via ORAL
  Filled 2014-09-23 (×2): qty 1

## 2014-09-23 MED ORDER — ACETAMINOPHEN 325 MG PO TABS: 650.0000 mg | ORAL_TABLET | Freq: Four times a day (QID) | ORAL | Status: DC | PRN

## 2014-09-23 MED ORDER — ONDANSETRON HCL 4 MG/2ML IJ SOLN: 4.0000 mg | Freq: Four times a day (QID) | INTRAMUSCULAR | Status: DC | PRN

## 2014-09-23 NOTE — H&P (Signed)
Primary cardiologist: Previously followed by Dr, Lewayne Bunting  Reason for admission: Atrial flutter  Clinical Summary Sean [name] is a 54 y.o.male with past medical history outlined below, presented to the ER today complaining of intermittent abdominal symptoms including a "flipping" sensation after eating, some mild intermittent epigastric discomfort, and also belching. He noticed these symptoms most prominently yesterday evening and then again this morning associated with nausea, but has been having intermittent symptoms in retrospect over the last few weeks. He has not felt any sense of palpitations or chest pain, but noted that his heart rate was "147" when he checked it with a home blood pressure cuff today prompting his evaluation at the hospital.  He has a complex history of cardiac arrhythmia, I reviewed the available records present in EPIC, although do not have complete information. He was evaluated by Dr. Ladona Ridgel back in 2007-2008, had an unsuccessful RFA of an epicardial posteroseptal accessory pathway with WPW, also PAF/flutter, and PSVT as outlined. He was treated with medications including Rythmol and also procainamide, ultimately referred to Dr. Sampson Goon at [facility] and underwent another ablation procedure, details are not clear. He has had no cardiology follow-up for several years, not taking any regular medications at home. Presumably, has done well without any recurring arrhythmias.  He is noted to be in what looks to be atrial flutter with 2:1 block, duration is not clear. He has been placed on intravenous diltiazem in the ER, heart rate is around 140 at this point, he is otherwise hemodynamically stable.  CHADSVASC score is 0 based on available information (no definite hypertension, diabetes mellitus, prior stroke, LV dysfunction, calcium score 0 in 2007).    No Known Allergies  Home Medications L-Arginine supplements Multivitamin Recently has taken aspirin, but not  regularly   Past Medical History  Diagnosis Date  . WPW (Wolff-Parkinson-White syndrome)     Epicardial posteroseptal accessory pathway - unsuccessful RFA 2007   . Atrial fibrillation   . PSVT (paroxysmal supraventricular tachycardia)     Orthodromic AVRT  . OSA (obstructive sleep apnea)     Past Surgical History  Procedure Laterality Date  . Tonsillectomy    . Oropharyngeal release surgery      Family History  Problem Relation Age of Onset  . Atrial fibrillation Father   . Atrial fibrillation Mother     Social History Sean [name] reports that he has quit smoking. His smoking use included Cigarettes. He does not have any smokeless tobacco history on file. Sean [name] reports that he does not drink alcohol.  Review of Systems Complete review of systems negative except as otherwise outlined in the clinical summary and also the following. No exertional chest pain, orthopnea, or PND.  Physical Examination Temp:  [98.1 F (36.7 C)] 98.1 F (36.7 C) (06/06 1544) Pulse Rate:  [73-147] 139 (06/06 1915) Resp:  [13-25] 14 (06/06 1915) BP: (97-134)/(63-96) 115/71 mmHg (06/06 1915) SpO2:  [95 %-100 %] 99 % (06/06 1915)  Intake/Output Summary (Last 24 hours) at 09/23/14 2016 Last data filed at 09/23/14 1850  Gross per 24 hour  Intake   2000 ml  Output      0 ml  Net   2000 ml   Telemetry: Atrial flutter with 2:1 block.  Gen.: Morbidly obese male in no acute distress. HEENT: Conjunctiva and lids normal, oropharynx clear. Neck: Supple, increased girth without obvious elevated JVP or carotid bruits, no thyromegaly. Lungs: Clear to auscultation, nonlabored breathing at rest. Cardiac: Rapid regular rate and  rhythm, indistinct PMI, no obvious gallop, no pericardial rub. Abdomen: Obese, nontender, bowel sounds present, no guarding or rebound. Extremities: No pitting edema, distal pulses 2+. Skin: Warm and dry. Musculoskeletal: No kyphosis. Neuropsychiatric: Alert and oriented  x3, affect grossly appropriate.   Lab Results  Basic Metabolic Panel:  Recent Labs Lab 09/23/14 1555  NA 139  K 3.9  CL 103  CO2 27  GLUCOSE 112*  BUN 18  CREATININE 1.32*  CALCIUM 9.2    Liver Function Tests:  Recent Labs Lab 09/23/14 1555  AST 24  ALT 31  ALKPHOS 60  BILITOT 0.3  PROT 7.8  ALBUMIN 3.6    CBC:  Recent Labs Lab 09/23/14 1555  WBC 13.7*  NEUTROABS 10.1*  HGB 13.9  HCT 42.9  MCV 83.3  PLT 312    Cardiac Enzymes: POC troponin I 0.0  TSH: 2.35  Imaging Chest x-ray 09/23/2014: FINDINGS: The heart size and mediastinal contours are within normal limits. Both lungs are clear. The visualized skeletal structures are unremarkable.  IMPRESSION: No active disease.   Impression  1. Probable atrial flutter with 2:1 block associated with RVR, not clearly symptomatic in terms of palpitations, and therefore duration is unclear. Patient has had some intermittent abdominal symptoms as detailed above, potentially related, although not clear. He has a low thromboembolic risk score based on available information. Currently on intravenous diltiazem initiated in the ER, heart rate around 140. Case was discussed with Dr. Ladona Ridgelaylor on call for EP.  2. History of WPW with epicardial posteroseptal assess pathway, status post unsuccessful RFA in 2007.  3. History of PAF/flutter. Patient was [facility] at some point in 2008 or after, and underwent a repeat ablation procedure, details are not complete. I am not certain whether this was to address his atrial arrhythmias or his accessory pathway.  4. Morbid obesity.  5. History of OSA status post oropharyngeal release surgery in the past.   Recommendations  I discussed the case with Dr. Ladona Ridgelaylor on call for EP. Plan at this time will be to admit him for further management, continue intravenous diltiazem and initiate IV digoxin load. Holding off amiodarone for the time being, particularly  since duration of arrhythmia is not certain. Xarelto will be started for anticoagulation as he will likely need to undergo TEE cardioversion, keep NPO after midnight. Rhythm will be reviewed further by EP tomorrow in terms of treatment options. Patient also concerned about his gallbladder in light of abdominal symptoms. We will obtain amylase and lipase, LFTs were normal. Might consider an abdominal ultrasound if symptoms continue following stabilization of his rhythm. He will also need a follow-up echocardiogram once his rhythm has been further stabilized and heart rate is under control.  Jonelle SidleSamuel G. Shaunda Tipping, M.D., F.A.C.C.

## 2014-09-23 NOTE — ED Notes (Signed)
PT states that he went to [employer] on Sunday and states after he ate yesterday, his stomach was flipping and had pain today.  Pt states upper abdominal are pain and into back alittle higher than shoulder blades and some right upper quadrant pain.  Pt reports some nausea. Pt reports he has had ablations about 2007.  PT states his heart rate read 141 at home.  PT states when he would take a deep breath felt like his stomach was turning over and reports recent cough.

## 2014-09-23 NOTE — ED Notes (Signed)
Admitting MD notified that there are no step down beds and stated that he would have to go to ICU.  Increased rate on Cardizem

## 2014-09-23 NOTE — Progress Notes (Signed)
eLink Physician-Brief Progress Note Patient Name: Merry Proudhillip L Pareja DOB: 08/10/1960 MRN: [id number]   Date of Service  09/23/2014  HPI/Events of Note  54 yo male with PMH WPW - s/p unsuccessful ablation in 2007, PAF/Flutter, OSA and Morbid Obesity. Admitted to ICU tonight for AFlutter with RVR. Current medical regimen includes Digoxin, Cardizem IV infusion. Management per Cardiology.  eICU Interventions  Continue present management.      Intervention Category Evaluation Type: New Patient Evaluation  Lenell AntuSommer,Brooklyne Radke Eugene 09/23/2014, 10:44 PM

## 2014-09-23 NOTE — ED Provider Notes (Signed)
Patient with a history of past atrial fibrillation status post ablation 2. The most recent one was around 2008. He presents with a flipping feeling in his stomach. He had a brief episode of pain in his epigastrium and between the shoulder blades. He doesn't have any ongoing pain other than some belching and mild discomfort in his epigastrium. He does not report any chest discomfort. There is no shortness of breath. There is no exertional symptoms. His EKG shows atrial flutter and a rapid rate in the 140s. We will start him on Cardizem. Check troponin. Consult cardiology.  Rolan BuccoMelanie Melinda Gwinner, MD 09/23/14 [phone]

## 2014-09-23 NOTE — ED Provider Notes (Signed)
CSN: [id number]     Arrival date & time 09/23/14  1451 History   First MD Initiated Contact with Patient 09/23/14 1610     Chief Complaint  Patient presents with  . Abdominal Pain     (Consider location/radiation/quality/duration/timing/severity/associated sxs/prior Treatment) Patient is a 54 y.o. male presenting with abdominal pain.  Abdominal Pain Pain location:  Epigastric Pain quality: aching   Pain radiates to:  Does not radiate Pain severity:  Moderate Onset quality:  Gradual Duration:  2 days Timing:  Constant Progression:  Waxing and waning Chronicity:  New Context: not alcohol use, not laxative use, not medication withdrawal, not retching, not sick contacts, not suspicious food intake and not trauma   Relieved by:  Nothing Worsened by:  Nothing tried Ineffective treatments:  None tried Associated symptoms: nausea   Associated symptoms: no anorexia, no chest pain, no chills, no cough, no diarrhea, no dysuria, no fatigue, no fever, no hematuria, no shortness of breath, no sore throat and no vomiting   Risk factors: obesity     Past Medical History  Diagnosis Date  . WPW (Wolff-Parkinson-White syndrome)     Epicardial posteroseptal accessory pathway - unsuccessful RFA 2007   . Atrial fibrillation   . PSVT (paroxysmal supraventricular tachycardia)     Orthodromic AVRT  . OSA (obstructive sleep apnea)    Past Surgical History  Procedure Laterality Date  . Tonsillectomy    . Oropharyngeal release surgery     Family History  Problem Relation Age of Onset  . Atrial fibrillation Father   . Atrial fibrillation Mother    History  Substance Use Topics  . Smoking status: Former Smoker    Types: Cigarettes  . Smokeless tobacco: Not on file  . Alcohol Use: No    Review of Systems  Constitutional: Negative for fever, chills, appetite change and fatigue.  HENT: Negative for congestion, ear pain, facial swelling, mouth sores and sore throat.   Eyes: Negative for  visual disturbance.  Respiratory: Negative for cough, chest tightness and shortness of breath.   Cardiovascular: Negative for chest pain and palpitations.  Gastrointestinal: Positive for nausea and abdominal pain. Negative for vomiting, diarrhea, blood in stool and anorexia.  Endocrine: Negative for cold intolerance and heat intolerance.  Genitourinary: Negative for dysuria, frequency, hematuria, decreased urine volume and difficulty urinating.  Musculoskeletal: Negative for back pain and neck stiffness.  Skin: Negative for rash.  Neurological: Negative for dizziness, weakness, light-headedness and headaches.  All other systems reviewed and are negative.     Allergies  Review of patient's allergies indicates no known allergies.  Home Medications   Prior to Admission medications   Medication Sig Start Date End Date Taking? Authorizing Provider  acetaminophen (TYLENOL) 325 MG tablet Take 650 mg by mouth every 6 (six) hours as needed for mild pain.   Yes Historical Provider, MD  Multiple Vitamin (MULTI-VITAMIN PO) Take 1 tablet by mouth daily.   Yes Historical Provider, MD  OVER THE COUNTER MEDICATION Take 1 tablet by mouth daily. "L-Apolene"   Yes Historical Provider, MD   BP 119/75 mmHg  Pulse 140  Temp(Src) 97.5 F (36.4 C) (Oral)  Resp 23  SpO2 98% Physical Exam  Constitutional: He is oriented to person, place, and time. He appears well-nourished. No distress.  HENT:  Head: Normocephalic and atraumatic.  Right Ear: External ear normal.  Left Ear: External ear normal.  Eyes: Pupils are equal, round, and reactive to light. Right eye exhibits no discharge. Left eye exhibits  no discharge. No scleral icterus.  Neck: Normal range of motion. Neck supple.  Cardiovascular: Tachycardia present.  Exam reveals no gallop and no friction rub.   No murmur heard. Pulmonary/Chest: Effort normal and breath sounds normal. No stridor. No respiratory distress. He has no wheezes. He has no rales.  He exhibits no tenderness.  Abdominal: Soft. He exhibits no distension and no mass. There is tenderness in the epigastric area. There is no rebound and no guarding.  Musculoskeletal: He exhibits no edema or tenderness.  Neurological: He is alert and oriented to person, place, and time.  Skin: Skin is warm and dry. No rash noted. He is not diaphoretic. No erythema.    ED Course  Procedures (including critical care time) Labs Review Labs Reviewed  COMPREHENSIVE METABOLIC PANEL - Abnormal; Notable for the following:    Glucose, Bld 112 (*)    Creatinine, Ser 1.32 (*)    GFR calc non Af Amer 60 (*)    All other components within normal limits  CBC WITH DIFFERENTIAL/PLATELET - Abnormal; Notable for the following:    WBC 13.7 (*)    Neutro Abs 10.1 (*)    All other components within normal limits  LIPASE, BLOOD  TSH  T4, FREE  TROPONIN I  AMYLASE  LIPASE, BLOOD  TROPONIN I  TROPONIN I  BASIC METABOLIC PANEL  LIPID PANEL  Rosezena SensorI-STAT TROPOININ, ED    Imaging Review Dg Chest Port 1 View  09/23/2014   CLINICAL DATA:  Abdominal pain.  Recent cough.  EXAM: PORTABLE CHEST - 1 VIEW  COMPARISON:  None.  FINDINGS: The heart size and mediastinal contours are within normal limits. Both lungs are clear. The visualized skeletal structures are unremarkable.  IMPRESSION: No active disease.   Electronically Signed   By: Signa Kellaylor  Stroud M.D.   On: 09/23/2014 16:41     EKG Interpretation   Date/Time:  Monday September 23 2014 17:30:36 EDT Ventricular Rate:  140 PR Interval:    QRS Duration: 94 QT Interval:  345 QTC Calculation: 526 R Axis:   1 Text Interpretation:  Junctional tachycardia Borderline repolarization  abnormality Prolonged QT interval Confirmed by BELFI  MD, MELANIE ([id number])  on 09/23/2014 5:37:41 PM      MDM   54 year old male with a history of A. fib status post ablation who presents with 2 days of epigastric abdominal pain. History and exam as above. On arrival patient noted to be  tachycardic in the 140s. EKG with junctional tachycardia with a rate in the 140s. Labs were nonspecific for acute illness. Vagal maneuvers were attempted for possible SVT and were unsuccessful. IV fluids and pain meds were given for possible sinus tachycardia however patient remained tachycardic in the 140s. Bolus of diltiazem was given and patient placed on a drip. This also did not result the patient's tachycardia. Cardiology was consulted and will admit the patient to their service for further management.  Patient seen in conjunction with Dr. Fredderick PhenixBelfi.  Deniece PortelaPedro E. Cardama, MD. Resident  Final diagnoses:  Atrial Flutter     Drema PryPedro Cardama, MD 09/24/14 [id number]  Rolan BuccoMelanie Belfi, MD 09/27/14 [phone]

## 2014-09-23 NOTE — ED Notes (Signed)
Admitting MD in to talk with patient

## 2014-09-23 NOTE — Progress Notes (Signed)
MEDICATION RELATED CONSULT NOTE - INITIAL   Pharmacy Consult for digoxin loading Indication: AFlutter  No Known Allergies  Patient Measurements:     Vital Signs: Temp: 98.1 F (36.7 C) (06/06 1544) Temp Source: Oral (06/06 1544) BP: 93/66 mmHg (06/06 2045) Pulse Rate: 150 (06/06 2045) Intake/Output from previous day:   Intake/Output from this shift: Total I/O In: 100 [I.V.:100] Out: 500 [Urine:500]  Labs:  Recent Labs  09/23/14 1555  WBC 13.7*  HGB 13.9  HCT 42.9  PLT 312  CREATININE 1.32*  ALBUMIN 3.6  PROT 7.8  AST 24  ALT 31  ALKPHOS 60  BILITOT 0.3   CrCl cannot be calculated (Unknown ideal weight.).   Assessment: [age] YOM here and found to be in AFlutter. Has a cardiac arrhythmia history, however has not been seen regularly per notes, is not taking regular medications at home.  SCr 1.32. No height and weight yet in computer- called RN who stated she would call me back with information.  Plan:  -digoxin 0.5mg  IV x1, then 0.25mg  IV q6h x2 doses for a total of 1mg  load -will adjust this plan if patient has low body weight.   Itxel Wickard D. Yariah Selvey, PharmD, BCPS Clinical Pharmacist Pager: [phone] 09/23/2014 9:43 PM

## 2014-09-24 DIAGNOSIS — I484 Atypical atrial flutter: Secondary | ICD-10-CM

## 2014-09-24 LAB — BASIC METABOLIC PANEL
Anion gap: 8 (ref 5–15)
BUN: 14 mg/dL (ref 6–20)
CHLORIDE: 104 mmol/L (ref 101–111)
CO2: 26 mmol/L (ref 22–32)
Calcium: 8.6 mg/dL — ABNORMAL LOW (ref 8.9–10.3)
Creatinine, Ser: 1.07 mg/dL (ref 0.61–1.24)
GFR calc Af Amer: >60 mL/min (ref >60)
Glucose, Bld: 118 mg/dL — ABNORMAL HIGH (ref 65–99)
Potassium: 3.8 mmol/L (ref 3.5–5.1)
SODIUM: 138 mmol/L (ref 135–145)

## 2014-09-24 LAB — TROPONIN I
Troponin I: <0.03 ng/mL (ref <0.031)
Troponin I: <0.03 ng/mL (ref <0.031)

## 2014-09-24 LAB — LIPID PANEL
Cholesterol: 157 mg/dL (ref 0–200)
HDL: 27 mg/dL — AB (ref >40)
LDL Cholesterol: 108 mg/dL — ABNORMAL HIGH (ref 0–99)
Total CHOL/HDL Ratio: 5.8 RATIO
Triglycerides: 111 mg/dL (ref <150)
VLDL: 22 mg/dL (ref 0–40)

## 2014-09-24 MED ORDER — SODIUM CHLORIDE 0.9 % IV SOLN
INTRAVENOUS | Status: DC
Start: 2014-09-24 — End: 2014-09-25
  Administered 2014-09-24: 18:00:00 via INTRAVENOUS

## 2014-09-24 NOTE — Consult Note (Signed)
ELECTROPHYSIOLOGY CONSULT NOTE    Patient ID: Sean Proudhillip L Dimiceli MRN: [id number], DOB/AGE: 54/05/1960 54 y.o.  Admit date: 09/23/2014 Date of Consult: 09/24/2014  Primary Physician: No PCP Per Patient Primary Cardiologist: Ladona Ridgelaylor  Reason for Consultation: atrial arrhythmias  HPI:  Sean [name] is a 54 y.o. male with a past medical history significant for WPW with epicardial posteroseptal pathway (unsuccessful endocardial ablation 2007; [facility] for [facility]), sleep apnea (the patient currently denies, s/p surgery), atrial fibrillation, and morbid obesity.  He presented yesterday after developing epigastric discomfort and nausea.  He was found to be in 2:1 atrial flutter (atypical) and was admitted for further evaluation.  He has no sensation of tachy-palpitations, chest pain, shortness of breath, increased LE edema.  He has not had recent fevers or chills.  He has been placed on Diltiazem drip with conversion of atrial flutter to atrial fibrillation.    Lab work is reviewed.  Last echo 2007 demonstrated EF 55-65%, mildly dilated LA.   Past Medical History  Diagnosis Date  . WPW (Wolff-Parkinson-White syndrome)     Epicardial posteroseptal accessory pathway - unsuccessful RFA 2007   . Atrial fibrillation   . PSVT (paroxysmal supraventricular tachycardia)     Orthodromic AVRT  . OSA (obstructive sleep apnea)      Surgical History:  Past Surgical History  Procedure Laterality Date  . Tonsillectomy    . Oropharyngeal release surgery       Prescriptions prior to admission  Medication Sig Dispense Refill Last Dose  . acetaminophen (TYLENOL) 325 MG tablet Take 650 mg by mouth every 6 (six) hours as needed for mild pain.   PRN  . Multiple Vitamin (MULTI-VITAMIN PO) Take 1 tablet by mouth daily.   09/22/2014 at Unknown time  . OVER THE COUNTER MEDICATION Take 1 tablet by mouth daily. "L-Apolene"   09/22/2014 at Unknown time    Inpatient Medications:  . digoxin   0.25 mg Intravenous Q6H  . rivaroxaban  20 mg Oral Q supper    Allergies: No Known Allergies  History   Social History  . Marital Status: Legally Separated    Spouse Name: N/A  . Number of Children: N/A  . Years of Education: N/A   Occupational History  . Truck driver    Social History Main Topics  . Smoking status: Former Smoker    Types: Cigarettes  . Smokeless tobacco: Not on file  . Alcohol Use: No  . Drug Use: No  . Sexual Activity: Not on file   Other Topics Concern  . Not on file   Social History Narrative     Family History  Problem Relation Age of Onset  . Atrial fibrillation Father   . Atrial fibrillation Mother      Review of Systems: All other systems reviewed and are otherwise negative except as noted above.  Physical Exam: Filed Vitals:   09/24/14 0100 09/24/14 0200 09/24/14 0300 09/24/14 0344  BP: 116/54 106/71 106/85   Pulse:      Temp:    97.8 F (36.6 C)  TempSrc:    Oral  Resp: 22 26 21    Height:      Weight:      SpO2:    98%    GEN- The patient is morbidly obese appearing, alert and oriented x 3 today.   HEENT: normocephalic, atraumatic; sclera clear, conjunctiva pink; hearing intact; oropharynx clear; neck supple  Lungs- Clear to ausculation bilaterally, normal work of breathing.  No wheezes, rales, rhonchi Heart- Tachycardic irregular rate and rhythm  GI- obese, non-tender, non-distended, bowel sounds present  Extremities- no clubbing, cyanosis, or edema; DP/PT/radial pulses 2+ bilaterally MS- no significant deformity or atrophy Skin- warm and dry, no rash or lesion Psych- euthymic mood, full affect Neuro- strength and sensation are intact  Labs:   Lab Results  Component Value Date   WBC 13.7* 09/23/2014   HGB 13.9 09/23/2014   HCT 42.9 09/23/2014   MCV 83.3 09/23/2014   PLT 312 09/23/2014    Recent Labs Lab 09/23/14 1555 09/24/14 0230  NA 139 138  K 3.9 3.8  CL 103 104  CO2 27 26  BUN 18 14  CREATININE 1.32*  1.07  CALCIUM 9.2 8.6*  PROT 7.8  --   BILITOT 0.3  --   ALKPHOS 60  --   ALT 31  --   AST 24  --   GLUCOSE 112* 118*      Radiology/Studies: Dg Chest Port 1 View 09/23/2014   CLINICAL DATA:  Abdominal pain.  Recent cough.  EXAM: PORTABLE CHEST - 1 VIEW  COMPARISON:  None.  FINDINGS: The heart size and mediastinal contours are within normal limits. Both lungs are clear. The visualized skeletal structures are unremarkable.  IMPRESSION: No active disease.   Electronically Signed   By: Signa Kell M.D.   On: 09/23/2014 16:41    EKG:2:1 atrial flutter, ventricular rate 147  TELEMETRY: atrial flutter with degeneration into atrial fibrillation, ventricular rates 90-120's  Assessment/Plan: 1.  Atrial fibrillation/atrial flutter The patient has developed recurrent atrial arrhythmias of unknown duration.  He has been placed on Diltiazem drip with conversion of atrial flutter to atrial fibrillation.   CHADS2VASC score is 0.   Continue Xarelto and plan TEE/DCCV tomorrow.   With morbid obesity, likelihood of maintaining SR is decreased.  Discussed lifestyle modifications with patient. If he reverts back to atrial fib, would anticipate a rate control strategy although I think we should attempt DCCV once.  2.  WPW S/p ablation x2 2007 - no evidence of any pre-excitation on ECG  3.  Morbid obesity Weight loss encouraged.  Will refer to the AF nutrition program at discharge  4.  OSA S/p oropharyngeal release surgery   Signed, Gypsy Balsam, NP 09/24/2014 7:26 AM  EP Attending  Patient seen and examined. Agree with history and exam as documented by Gypsy Balsam, NP with minimal modification. He is now in atrial fibrillation with a controlled VR (95-105). Will continue Xarelto and plan DCCV tomorrow. Long term the likelihood of NSR low with his massive obesity.  Leonia Reeves.D.

## 2014-09-24 NOTE — Discharge Instructions (Signed)

## 2014-09-24 NOTE — Care Management Note (Signed)
Case Management Note  Patient Details  Name: [name] MRN: [id number] Date of Birth: 04/17/1961  Subjective/Objective:          Adm w at flutter         Action/Plan:lives at home   Expected Discharge Date:                  Expected Discharge Plan:  Home/Self Care  In-House Referral:     Discharge planning Services  CM Consult, Medication Assistance, Indigent Health Clinic  Post Acute Care Choice:    Choice offered to:     DME Arranged:    DME Agency:     HH Arranged:    HH Agency:     Status of Service:     Medicare Important Message Given:    Date Medicare IM Given:    Medicare IM give by:    Date Additional Medicare IM Given:    Additional Medicare Important Message give by:     If discussed at Long Length of Stay Meetings, dates discussed:    Additional Comments: ur review done. Left 30day free and copay assist card for xarelto in room. No ins listed. Left pt assist form for xarelto in room and inform on [facility] and wellness clinic if pt does not have ins.will cont to follow.  Hanley Haysowell, Hamlin Devine T, RN 09/24/2014, 10:03 AM

## 2014-09-25 ENCOUNTER — Encounter (HOSPITAL_COMMUNITY)
Admission: EM | Disposition: A | Discharge status: Home / Self Care | Payer: Self-pay | Source: Home / Self Care | Attending: Internal Medicine

## 2014-09-25 ENCOUNTER — Inpatient Hospital Stay (HOSPITAL_COMMUNITY): Payer: Self-pay

## 2014-09-25 DIAGNOSIS — I4891 Unspecified atrial fibrillation: Secondary | ICD-10-CM

## 2014-09-25 SURGERY — ECHOCARDIOGRAM, TRANSESOPHAGEAL
Anesthesia: Monitor Anesthesia Care

## 2014-09-25 MED ORDER — DILTIAZEM HCL ER COATED BEADS 120 MG PO CP24: 120.0000 mg | ORAL_CAPSULE | Freq: Every day | ORAL | Status: DC

## 2014-09-25 MED ORDER — PERFLUTREN LIPID MICROSPHERE
INTRAVENOUS | Status: AC
  Administered 2014-09-25: 2 mL via INTRAVENOUS
  Filled 2014-09-25: qty 10

## 2014-09-25 MED ORDER — DIGOXIN 250 MCG PO TABS
0.2500 mg | ORAL_TABLET | Freq: Every day | ORAL | Status: DC
  Filled 2014-09-25: qty 1

## 2014-09-25 MED ORDER — RIVAROXABAN 20 MG PO TABS: 20.0000 mg | ORAL_TABLET | Freq: Every day | ORAL | Status: DC

## 2014-09-25 MED ORDER — PERFLUTREN LIPID MICROSPHERE
1.0000 mL | INTRAVENOUS | Status: AC | PRN
  Administered 2014-09-25: 2 mL via INTRAVENOUS
  Filled 2014-09-25: qty 10

## 2014-09-25 MED ORDER — DILTIAZEM HCL ER COATED BEADS 120 MG PO CP24
120.0000 mg | ORAL_CAPSULE | Freq: Every day | ORAL | Status: DC
  Administered 2014-09-25: 120 mg via ORAL
  Filled 2014-09-25: qty 1

## 2014-09-25 MED ORDER — PERFLUTREN LIPID MICROSPHERE
INTRAVENOUS | Status: AC
  Filled 2014-09-25: qty 10

## 2014-09-25 NOTE — Progress Notes (Signed)
  Echocardiogram 2D Echocardiogram has been performed.  Sean [name], Sean [name] R 09/25/2014, 1:06 PM

## 2014-09-25 NOTE — Discharge Summary (Signed)
ELECTROPHYSIOLOGY PROCEDURE DISCHARGE SUMMARY    Patient ID: Sean [name],  MRN: [id number], DOB/AGE: 54/27/1962 54 y.o.  Admit date: 09/23/2014 Discharge date: 09/25/2014  Primary Care Physician: No PCP Per Patient Primary Cardiologist: Ladona Ridgel  Primary Discharge Diagnosis:  Atrial flutter and atrial fibrillation  Secondary Discharge Diagnosis:  1.  Morbid obesity 2.  WPW s/p ablation 3.  Sleep apnea s/p surgery  No Known Allergies   Brief HPI/Hospital Course:  Sean [name] is a 54 y.o. male with a past medical history significant for WPW s/p ablation, sleep apnea s/p surgical repair, morbid obesity, and atrial fibrillation.  He presented to the hospital on the day of admission with epigastric discomfort and nausea and was found to be in atrial flutter. He was placed on IV Cardizem and Xarelto with plans for TEE/DCCV but converted to SR spontaneously. Echocardiogram was done and demonstrated EF 55-60%, mild LVH, LA 47.  He was seen by Dr Ladona Ridgel and considered stable for discharge to home.  Weight loss is encouraged. The patient will follow up in the AF clinic.   Physical Exam: Filed Vitals:   09/25/14 1018 09/25/14 1100 09/25/14 1143 09/25/14 1200  BP: 124/78 120/64  127/81  Pulse:      Temp:   98.1 F (36.7 C)   TempSrc:   Oral   Resp:  Height:      Weight:      SpO2:   92%       Labs:   Lab Results  Component Value Date   WBC 13.7* 09/23/2014   HGB 13.9 09/23/2014   HCT 42.9 09/23/2014   MCV 83.3 09/23/2014   PLT 312 09/23/2014    Recent Labs Lab 09/23/14 1555 09/24/14 0230  NA 139 138  K 3.9 3.8  CL 103 104  CO2 27 26  BUN 18 14  CREATININE 1.32* 1.07  CALCIUM 9.2 8.6*  PROT 7.8  --   BILITOT 0.3  --   ALKPHOS 60  --   ALT 31  --   AST 24  --   GLUCOSE 112* 118*     Discharge Medications:    Medication List    TAKE these medications        acetaminophen 325 MG tablet  Commonly known as:  TYLENOL  Take 650 mg by  mouth every 6 (six) hours as needed for mild pain.     diltiazem 120 MG 24 hr capsule  Commonly known as:  CARDIZEM CD  Take 1 capsule (120 mg total) by mouth daily.     MULTI-VITAMIN PO  Take 1 tablet by mouth daily.     OVER THE COUNTER MEDICATION  Take 1 tablet by mouth daily. "L-Apolene"     rivaroxaban 20 MG Tabs tablet  Commonly known as:  XARELTO  Take 1 tablet (20 mg total) by mouth daily with supper.        Disposition:      Discharge Instructions    Diet - low sodium heart healthy    Complete by:  As directed      Increase activity slowly    Complete by:  As directed           Follow-up Information    Follow up with [facility] On 10/07/2014.   Why:  at [employer] information:   [address] [place] [phone]      Duration of Discharge Encounter: Greater  than 30 minutes including physician time.  Signed, Gypsy BalsamAmber Seiler, NP 09/25/2014 1:36 PM   EP Attending  Patient seen and examined. He has reverted to NSR spontaneously. He will be discharged home and continue anti-coagulation and will followup in the atrial fib clinic. He is not a good candidate for amiodarone due to his massive obesity.  Leonia ReevesGregg Jailin Manocchio,M.d.

## 2014-09-25 NOTE — Progress Notes (Signed)
SUBJECTIVE: The patient is doing well today.  At this time, he denies chest pain, shortness of breath, or any new concerns.  CURRENT MEDICATIONS:  . digoxin  0.25 mg Oral Daily  . rivaroxaban  20 mg Oral Q supper   . sodium chloride 20 mL/hr at 09/24/14 1816  . diltiazem (CARDIZEM) infusion 5 mg/hr (09/25/14 0530)    OBJECTIVE: Physical Exam: Filed Vitals:   09/25/14 0530 09/25/14 0600 09/25/14 0700 09/25/14 0702  BP:  126/66 126/71 126/71  Pulse:  67 74   Temp:   98 F (36.7 C)   TempSrc:   Oral   Resp: 24 25 23    Height:      Weight:      SpO2:    95%    Intake/Output Summary (Last 24 hours) at 09/25/14 0916 Last data filed at 09/25/14 0700  Gross per 24 hour  Intake 972.17 ml  Output   1601 ml  Net -628.83 ml    Telemetry reveals conversion to sinus rhythm at 5AM this morning  GEN- The patient is morbidly obese appearing, alert and oriented x 3 today.   Head- normocephalic, atraumatic Eyes-  Sclera clear, conjunctiva pink Ears- hearing intact Oropharynx- clear Neck- supple, no JVP Lymph- no cervical lymphadenopathy Lungs- Clear to ausculation bilaterally, normal work of breathing Heart- Regular rate and rhythm, no murmurs, rubs or gallops  GI- soft, NT, ND, + BS Extremities- no clubbing, cyanosis, or edema Skin- no rash or lesion Psych- euthymic mood, full affect Neuro- strength and sensation are intact  LABS: Basic Metabolic Panel:  Recent Labs  81/19/1404/10/02 1555 09/24/14 0230  NA 139 138  K 3.9 3.8  CL 103 104  CO2 27 26  GLUCOSE 112* 118*  BUN 18 14  CREATININE 1.32* 1.07  CALCIUM 9.2 8.6*   Liver Function Tests:  Recent Labs  09/23/14 1555  AST 24  ALT 31  ALKPHOS 60  BILITOT 0.3  PROT 7.8  ALBUMIN 3.6    Recent Labs  09/23/14 1555 09/23/14 2150  LIPASE 27 24  AMYLASE  --  48   CBC:  Recent Labs  09/23/14 1555  WBC 13.7*  NEUTROABS 10.1*  HGB 13.9  HCT 42.9  MCV 83.3  PLT 312   Cardiac Enzymes:  Recent  Labs  09/23/14 2150 09/24/14 0230 09/24/14 0930  TROPONINI <0.03 <0.03 <0.03   Fasting Lipid Panel:  Recent Labs  09/24/14 0230  CHOL 157  HDL 27*  LDLCALC 108*  TRIG 111  CHOLHDL 5.8   Thyroid Function Tests:  Recent Labs  09/23/14 1700  TSH 2.355    RADIOLOGY: Dg Chest Port 1 View 09/23/2014   CLINICAL DATA:  Abdominal pain.  Recent cough.  EXAM: PORTABLE CHEST - 1 VIEW  COMPARISON:  None.  FINDINGS: The heart size and mediastinal contours are within normal limits. Both lungs are clear. The visualized skeletal structures are unremarkable.  IMPRESSION: No active disease.   Electronically Signed   By: Signa Kellaylor  Stroud M.D.   On: 09/23/2014 16:41    ASSESSMENT AND PLAN:  Active Problems:   Atrial flutter  1. Atrial fibrillation/atrial flutter The patient has developed recurrent atrial arrhythmias of unknown duration.  He has been placed on Diltiazem drip with conversion of atrial flutter to atrial fibrillation and now SR CHADS2VASC score is 0 - continue Xarelto for 4 weeks With morbid obesity, likelihood of maintaining SR is decreased. Discussed lifestyle modifications with patient. If he reverts back to atrial fib,  would anticipate a rate control strategy   Will convert Cardizem to po and discharge on Cardizem CD  daily Echo today  2. WPW S/p ablation x2 2007 - no evidence of any pre-excitation on ECG  3. Morbid obesity Weight loss encouraged. Will refer to the AF nutrition program at discharge  4. OSA S/p oropharyngeal release surgery  Will plan to discharge to home today and schedule outpatient follow up in the AF clinic.  Gypsy Balsam, NP 09/25/2014 9:18 AM  EP Attending  See above and my prior note.  Leonia Reeves.D.

## 2014-10-07 ENCOUNTER — Encounter (HOSPITAL_COMMUNITY): Payer: Self-pay | Admitting: Nurse Practitioner

## 2014-10-07 ENCOUNTER — Ambulatory Visit (HOSPITAL_COMMUNITY)
Admit: 2014-10-07 | Discharge: 2014-10-07 | Disposition: A | Discharge status: Home / Self Care | Payer: Self-pay | Source: Ambulatory Visit | Attending: Nurse Practitioner | Admitting: Nurse Practitioner

## 2014-10-07 VITALS — BP 150/96 | HR 75 | Ht 72.0 in | Wt 396.2 lb

## 2014-10-07 DIAGNOSIS — G4733 Obstructive sleep apnea (adult) (pediatric): Secondary | ICD-10-CM | POA: Insufficient documentation

## 2014-10-07 DIAGNOSIS — I480 Paroxysmal atrial fibrillation: Secondary | ICD-10-CM | POA: Insufficient documentation

## 2014-10-07 NOTE — Progress Notes (Signed)
[name] ID: Sean [name], male   DOB: 11/29/1960, 54 y.o.   MRN: [id number]    Primary Care Physician: Sean [name] Primary Cardiologist:None Primary Electrophysiologist:None Referring Physician:[facility] [name]   Sean [name] is a 54 y.o. male with a h/o  paroxysmal atrial fibrillation who presents for consultation in the [facility].  The [name] was initially diagnosed with atrial fibrillation 6/6  after presenting with symptoms of rt sided abdominal pain and a sensation of stomach flipping following the evening meal. Pt was concerned he may have gall bladder disease and presented to the ER. He as foung to have afib with RVR and converted on cadridem drip. He was discharged on po cardizem and Xarelto x one month with a chadsvasc score of 0. He currently denies tobacco/alcohol use, Had surgery for sleep apnea in the late 1990's and denies snoring currently. He does have a lot of stress caring for his elderly dad and does not exercise. Since being out of the hospital, he has not noticed any further irregular heart beat.   Today, he denies symptoms of palpitations, chest pain, shortness of breath, orthopnea, PND, lower extremity edema, dizziness, presyncope, syncope, snoring, daytime somnolence, bleeding, or neurologic sequela. The [name] is tolerating medications without difficulties and is otherwise without complaint today.    Atrial Fibrillation Risk Factors:  he does not have symptoms or diagnosis of sleep apnea. Prior surgery for sleep apnea  he does not have a history of rheumatic fever.  he does not have a history of alcohol use.  he has a BMI of Body mass index is 53.72 kg/(m^2).[name] Filed Weights   10/07/14 1005  Weight: 396 lb 3.2 oz (179.715 kg)    LA size: 47mm   Atrial Fibrillation Management history:  Previous antiarrhythmic drugs: none  Previous cardioversions: none  Previous ablations:none  CHADS2VASC score:0  Anticoagulation  history: currently on xarelto x 30 days following conversion to SR   Past Medical History  Diagnosis Date  . WPW (Wolff-Parkinson-White syndrome)     Epicardial posteroseptal accessory pathway - unsuccessful RFA 2007   . Atrial fibrillation   . PSVT (paroxysmal supraventricular tachycardia)     Orthodromic AVRT  . OSA (obstructive sleep apnea)    Past Surgical History  Procedure Laterality Date  . Tonsillectomy    . Oropharyngeal release surgery      Current Outpatient Prescriptions  Medication Sig Dispense Refill  . acetaminophen (TYLENOL) 325 MG tablet Take 650 mg by mouth every 6 (six) hours as needed for mild pain.    [name] diltiazem (CARDIZEM CD) 120 MG 24 hr capsule Take 1 capsule (120 mg total) by mouth daily. 30 capsule 1  . Multiple Vitamin (MULTI-VITAMIN PO) Take 1 tablet by mouth daily.    [name] OVER THE COUNTER MEDICATION Take 1 tablet by mouth daily. "L-Apolene"    . rivaroxaban (XARELTO) 20 MG TABS tablet Take 1 tablet (20 mg total) by mouth daily with supper. 30 tablet 0   Sean current facility-administered medications for this encounter.    Sean Known Allergies  History   Social History  . Marital Status: Legally Separated    Spouse Name: N/A  . Number of Children: N/A  . Years of Education: N/A   Occupational History  . Truck driver    Social History Main Topics  . Smoking status: Former Smoker    Types: Cigarettes  . Smokeless tobacco: Not on file  . Alcohol Use: Sean  . Drug Use:  Sean  . Sexual Activity: Not on file   Other Topics Concern  . Not on file   Social History Narrative    Family History  Problem Relation Age of Onset  . Atrial fibrillation Father   . Atrial fibrillation Mother    The [name] does not have a history of early familial atrial fibrillation or other arrhythmias.  ROS- All systems are reviewed and negative except as per the HPI above.  Physical Exam: Filed Vitals:   10/07/14 1005  BP: 150/96  Pulse: 75  Height: 6' (1.829  m)  Weight: 396 lb 3.2 oz (179.715 kg)    GEN- The [name] is well appearing, alert and oriented x 3 today.   Head- normocephalic, atraumatic Eyes-  Sclera clear, conjunctiva pink Ears- hearing intact Oropharynx- clear Neck- supple, Sean JVP Lymph- Sean cervical lymphadenopathy Lungs- Clear to ausculation bilaterally, normal work of breathing Heart- Regular rate and rhythm, Sean murmurs, rubs or gallops, PMI not laterally displaced GI- soft, NT, ND, + BS Extremities- Sean clubbing, cyanosis, or edema MS- Sean significant deformity or atrophy Skin- Sean rash or lesion Psych- euthymic mood, full affect Neuro- strength and sensation are intact  EKG NSR at 75 bpm, Pr int 146 ms, QRS 76 ms, Qtc 419 ms. Echo -Left ventricle: Technically limited. Contrast was used. The EF is 55-60% The cavity size was normal. Wall thickness was increased in a pattern of mild LVH. Regional wall motion abnormalities cannot be excluded. - Aorta: Mild dilitation of the aortic root. - Left atrium: The atrium was moderately dilated. - Right ventricle: The RV is poorly seen. However, RV funcion is normal.  Epic records are reviewed at length today  Assessment and Plan:  1. Atrial fibrillation, new onset The [name] has Symptomatic paroxysmal atrial fibrillation.  The patients CHAD2VASC score is 0.  he is  appropriately anticoagulated at this time. The [name] is adequately rate controlled with cardizem. Antiarrhythmic therapy to dates has included xarelto  The patients left atrial size is 47 mm.  Additional echo findings include normal EF. A long discussion with the [name] was had today regarding therapeutic strategies.  Extensive discussion of lifestyle modification including begin progressive daily aerobic exercise program, follow a low fat, low cholesterol diet, attempt to lose weight, reduce salt in diet and cooking, attend health education classes for weight control, improve dietary compliance and  continue current medications was also discussed.  Presently, our recommendations include :  1. PAF Continue Cardizem Can stop xarelto after completing the 30 day course for chadsvasc score of 1.  2. Morbid obesity As above, lifestyle modification was discussed at length including regular exercise and weight reduction. He was given diet info for free weight reduction class offered at [facility]. Weight loss strongly advised to help reduce future afib burden.   3. Obstructive sleep apnea The importance of adequate treatment of sleep apnea was discussed today in order to improve our ability to maintain sinus rhythm long term. Pt states he Sean longer has sleep apnea after corrective surgery years ago.   He will be given an appointment to establish with a cardiologist in 2-3 months. Afib clinic as needed.     [name] NP  Nurse Practitioner, [facility] 10/07/2014 4:05 PM

## 2014-10-07 NOTE — Patient Instructions (Signed)
Stop the xarelto once you finish the prescription   A scheduler will call you with new patient appointment for a cardiologist.

## 2014-11-05 ENCOUNTER — Other Ambulatory Visit (HOSPITAL_COMMUNITY): Payer: Self-pay | Admitting: *Deleted

## 2014-11-05 MED ORDER — DILTIAZEM HCL ER COATED BEADS 120 MG PO CP24: 120.0000 mg | ORAL_CAPSULE | Freq: Every day | ORAL | Status: DC

## 2015-01-13 ENCOUNTER — Encounter: Payer: Self-pay | Admitting: Cardiology

## 2015-01-13 ENCOUNTER — Ambulatory Visit (INDEPENDENT_AMBULATORY_CARE_PROVIDER_SITE_OTHER): Payer: Self-pay | Admitting: Cardiology

## 2015-01-13 VITALS — BP 106/80 | HR 126 | Ht 72.0 in | Wt 365.0 lb

## 2015-01-13 DIAGNOSIS — I4892 Unspecified atrial flutter: Secondary | ICD-10-CM

## 2015-01-13 DIAGNOSIS — I480 Paroxysmal atrial fibrillation: Secondary | ICD-10-CM | POA: Insufficient documentation

## 2015-01-13 DIAGNOSIS — Z8679 Personal history of other diseases of the circulatory system: Secondary | ICD-10-CM | POA: Insufficient documentation

## 2015-01-13 MED ORDER — DILTIAZEM HCL ER COATED BEADS 240 MG PO CP24: 240.0000 mg | ORAL_CAPSULE | Freq: Every day | ORAL | Status: DC

## 2015-01-13 NOTE — Progress Notes (Signed)
Cardiology Office Note   Date:  01/13/2015   ID:  [name], DOB [birth year], MRN [id number]  PCP:  No PCP Per Patient  Cardiologist:   Donato Schultz, MD   Chief Complaint  Patient presents with  . New Evaluation    Atrial Fib      History of Present Illness: Sean [name] is a 54 y.o. male here to establish care with history of WPW status post ablation x 2 (Here and Dr. Sampson Goon) 2007, morbid obesity, atrial fibrillation. He was discharged from the hospital from the electrophysiology service on 09/25/14. He presented to the hospital on the admission with epigastric discomfort and nausea and was found to be in atrial flutter. He was placed on both IV Cardizem as well as Xarelto and had plans for cardioversion but converted spontaneously to sinus rhythm.  Echo showed normal ejection fraction 55-60% with dilated left atrium of 47 mm. He was discharged on long-acting diltiazem 120 mg as well as Xarelto. Dr. Ladona Ridgel thought he would not be a good candidate for amiodarone due to his massive obesity.  He is under increased stress caring for his elderly dad. Since hospitalization is felt fairly well.  CHADS2VASC score:0  It was thought that given this score he could complete his Xarelto and stop 30 days.  He is being treated for obstructive sleep apnea.  -Down about 45 pounds. From 410 lbs.  He is asymptomatic.    Past Medical History  Diagnosis Date  . WPW (Wolff-Parkinson-White syndrome)     Epicardial posteroseptal accessory pathway - unsuccessful RFA 2007   . Atrial fibrillation   . PSVT (paroxysmal supraventricular tachycardia)     Orthodromic AVRT  . OSA (obstructive sleep apnea)     Past Surgical History  Procedure Laterality Date  . Tonsillectomy    . Oropharyngeal release surgery       Current Outpatient Prescriptions  Medication Sig Dispense Refill  . acetaminophen (TYLENOL) 325 MG tablet Take 650 mg by mouth every 6 (six) hours as needed for  mild pain.    [name] diltiazem (CARDIZEM CD) 120 MG 24 hr capsule Take 1 capsule (120 mg total) by mouth daily. 30 capsule 6   No current facility-administered medications for this visit.    Allergies:   Review of patient's allergies indicates no known allergies.    Social History:  The patient  reports that he has quit smoking. His smoking use included Cigarettes. He does not have any smokeless tobacco history on file. He reports that he does not drink alcohol or use illicit drugs.   Family History:  The patient's family history includes Atrial fibrillation in his father and mother.    ROS:  Please see the history of present illness.   Otherwise, review of systems are positive for none.   All other systems are reviewed and negative.    PHYSICAL EXAM: VS:  BP 106/80 mmHg  Pulse 91  Ht 6' (1.829 m)  Wt 365 lb (165.563 kg)  BMI 49.49 kg/m2  SpO2 95% , BMI Body mass index is 49.49 kg/(m^2). GEN: Well nourished, well developed, in no acute distress HEENT: normal Neck: no JVD, carotid bruits, or masses Cardiac: Irreg irreg at times, normal rate; no murmurs, rubs, or gallops,no edema  Respiratory:  clear to auscultation bilaterally, normal work of breathing GI: soft, nontender, nondistended, + BS MS: no deformity or atrophy Skin: warm and dry, no rash Neuro:  Strength and sensation are intact Psych: euthymic mood, full  affect   EKG:  EKG is ordered today. Today 01/13/15-atrial fibrillation heart rate 126 bpm.   Recent Labs: 09/23/2014: ALT 31; Hemoglobin 13.9; Platelets 312; TSH 2.355 09/24/2014: BUN 14; Creatinine, Ser 1.07; Potassium 3.8; Sodium 138    Lipid Panel    Component Value Date/Time   CHOL 157 09/24/2014 0230   TRIG 111 09/24/2014 0230   HDL 27* 09/24/2014 0230   CHOLHDL 5.8 09/24/2014 0230   VLDL 22 09/24/2014 0230   LDLCALC 108* 09/24/2014 0230      Wt Readings from Last 3 Encounters:  01/13/15 365 lb (165.563 kg)  10/07/14 396 lb 3.2 oz (179.715 kg)    09/23/14 392 lb 10.2 oz (178.1 kg)      Other studies Reviewed: Additional studies/ records that were reviewed today include: Prior hospital records, EKGs, echo. Review of the above records demonstrates: As above   ASSESSMENT AND PLAN:  1.  Paroxysmal atrial fibrillation- CHADS2VASC score:0. Agree with [name], atrial for ablation clinic. Lengthy discussion. Weight loss. Excellent job of losing 40 pounds. This will be the biggest prevention for him to help ward off atrial fibrillation. Continue with diltiazem although I'm going to increase this to 240 mg. Several questions answered. Asymptomatic.  2. Morbid obesity- modification. Diet. Excellent weight loss. This will ultimately be the best form of therapy for reduction in left atrial size. He stated that he had "lead bones "and that even at his size he would sink to the bottom of the pool. He said it would be next to impossible for him to be 180 pounds, this is what he weighed in ninth grade and when he turned to the side "he could stand in front of a toothpick ".  3. Obstructive sleep apnea-adequate treatment.  4. Prior WPW ablation.  5. Essential hypertension-elevated today. Increasing diltiazem.   Current medicines are reviewed at length with the patient today.  The patient does not have concerns regarding medicines.  The following changes have been made:  no change  Labs/ tests ordered today include:  No orders of the defined types were placed in this encounter.     Disposition:   FU with [name] in 2 weeks  Signed, Donato Schultz, MD  01/13/2015 10:32 AM    [facility] [address] [place], [place], [place]  [address] Phone: [phone]; Fax: [phone]

## 2015-01-13 NOTE — Patient Instructions (Signed)
Medication Instructions:  Please increase Cardizem to 240 mg a day. Continue all other medications as listed.  Follow-Up: Follow up in 2 weeks with Dr Anne Fu.  Thank you for choosing [facility]!!

## 2015-01-27 ENCOUNTER — Ambulatory Visit (INDEPENDENT_AMBULATORY_CARE_PROVIDER_SITE_OTHER): Payer: Self-pay | Admitting: Cardiology

## 2015-01-27 ENCOUNTER — Encounter: Payer: Self-pay | Admitting: Cardiology

## 2015-01-27 VITALS — BP 110/80 | HR 126 | Ht 72.0 in | Wt 363.8 lb

## 2015-01-27 DIAGNOSIS — I480 Paroxysmal atrial fibrillation: Secondary | ICD-10-CM

## 2015-01-27 DIAGNOSIS — I4892 Unspecified atrial flutter: Secondary | ICD-10-CM

## 2015-01-27 DIAGNOSIS — Z8679 Personal history of other diseases of the circulatory system: Secondary | ICD-10-CM

## 2015-01-27 MED ORDER — DILTIAZEM HCL ER COATED BEADS 120 MG PO CP24: 360.0000 mg | ORAL_CAPSULE | Freq: Every day | ORAL | Status: DC

## 2015-01-27 NOTE — Progress Notes (Signed)
Cardiology Office Note   Date:  01/27/2015   ID:  Sean [name], DOB 12-Jul-1960, MRN [id number]  PCP:  No PCP Per Patient  Cardiologist:   Donato Schultz, MD   No chief complaint on file.     History of Present Illness: Sean [name] is a 54 y.o. male here to establish care with history of WPW status post ablation x 2 (Here and Dr. Sampson Goon) 2007, morbid obesity, atrial fibrillation. He was discharged from the hospital from the electrophysiology service on 09/25/14. He presented to the hospital on the admission with epigastric discomfort and nausea and was found to be in atrial flutter. He was placed on both IV Cardizem as well as Xarelto and had plans for cardioversion but converted spontaneously to sinus rhythm.  Echo showed normal ejection fraction 55-60% with dilated left atrium of 47 mm. He was discharged on long-acting diltiazem 120 mg as well as Xarelto. Dr. Ladona Ridgel thought he would not be a good candidate for amiodarone due to his massive obesity.  He is under increased stress caring for his elderly dad. Since hospitalization is felt fairly well.  CHADS2VASC score:0  It was thought that given this score he could complete his Xarelto and stop 30 days.  He is being treated for obstructive sleep apnea.  -Down about 45 pounds. From 410 lbs.  He is asymptomatic.  01/27/15-atrial fibrillation is still not well rate controlled. Increasing diltiazem to 360. See below. Asymptomatic. Sometimes feels a ". In his stomach "when walking. It is not in his chest wall. He thinks this may be from decreasing the overall amount of food intake. He takes an enteric-coated aspirin. Watch bleeding.    Past Medical History  Diagnosis Date  . WPW (Wolff-Parkinson-White syndrome)     Epicardial posteroseptal accessory pathway - unsuccessful RFA 2007   . Atrial fibrillation (HCC)   . PSVT (paroxysmal supraventricular tachycardia) (HCC)     Orthodromic AVRT  . OSA (obstructive sleep  apnea)     Past Surgical History  Procedure Laterality Date  . Tonsillectomy    . Oropharyngeal release surgery       Current Outpatient Prescriptions  Medication Sig Dispense Refill  . acetaminophen (TYLENOL) 325 MG tablet Take 650 mg by mouth every 6 (six) hours as needed for mild pain.    [name] diltiazem (CARDIZEM CD) 240 MG 24 hr capsule Take 1 capsule (240 mg total) by mouth daily. 60 capsule 6   No current facility-administered medications for this visit.    Allergies:   Review of patient's allergies indicates no known allergies.    Social History:  The patient  reports that he has quit smoking. His smoking use included Cigarettes. He does not have any smokeless tobacco history on file. He reports that he does not drink alcohol or use illicit drugs.   Family History:  The patient's family history includes Atrial fibrillation in his father and mother.    ROS:  Please see the history of present illness.   Otherwise, review of systems are positive for none.   All other systems are reviewed and negative.    PHYSICAL EXAM: VS:  BP 110/80 mmHg  Pulse 126  Ht 6' (1.829 m)  Wt 363 lb 12.8 oz (165.019 kg)  BMI 49.33 kg/m2 , BMI Body mass index is 49.33 kg/(m^2). GEN: Well nourished, well developed, in no acute distress HEENT: normal Neck: no JVD, carotid bruits, or masses Cardiac: Irreg irreg at times, normal rate; no murmurs, rubs,  or gallops,no edema  Respiratory:  clear to auscultation bilaterally, normal work of breathing GI: soft, nontender, nondistended, + BS MS: no deformity or atrophy Skin: warm and dry, no rash Neuro:  Strength and sensation are intact Psych: euthymic mood, full affect   EKG:  EKG is ordered today. Today 01/27/15-atrial fibrillation heart rate 126 bpm-no significant change from prior personally viewed- 01/13/15-atrial fibrillation heart rate 126 bpm.  Echo: 09/25/14-normal ejection fraction.  Recent Labs: 09/23/2014: ALT 31; Hemoglobin 13.9;  Platelets 312; TSH 2.355 09/24/2014: BUN 14; Creatinine, Ser 1.07; Potassium 3.8; Sodium 138    Lipid Panel    Component Value Date/Time   CHOL 157 09/24/2014 0230   TRIG 111 09/24/2014 0230   HDL 27* 09/24/2014 0230   CHOLHDL 5.8 09/24/2014 0230   VLDL 22 09/24/2014 0230   LDLCALC 108* 09/24/2014 0230      Wt Readings from Last 3 Encounters:  01/27/15 363 lb 12.8 oz (165.019 kg)  01/13/15 365 lb (165.563 kg)  10/07/14 396 lb 3.2 oz (179.715 kg)      Other studies Reviewed: Additional studies/ records that were reviewed today include: Prior hospital records, EKGs, echo. Review of the above records demonstrates: As above   ASSESSMENT AND PLAN:  1.  Paroxysmal atrial fibrillation- CHADS2VASC score:0. Agree with [name], atrial fibrilation clinic. Lengthy discussion. Weight loss. Excellent job of losing 40 pounds. This will be the biggest prevention for him to help ward off atrial fibrillation. Continue with diltiazem although I'm going to increase this to 360 mg. he was concerned about the cost and would like this in the 120 mg tablet form. Several questions answered. Asymptomatic. Next step would be to add metoprolol. Discussed that at increased heart rates, this can lead to tachycardia induced cardiomyopathy.  2. Morbid obesity- modification. Diet. Excellent weight loss. This will ultimately be the best form of therapy for reduction in left atrial size. He stated that he had "lead bones "and that even at his size he would sink to the bottom of the pool. He said it would be next to impossible for him to be 180 pounds, this is what he weighed in ninth grade and when he turned to the side "he could stand in front of a toothpick ".  3. Obstructive sleep apnea-adequate treatment.  4. Prior WPW ablation.  5. Essential hypertension-much better today on increased diltiazem.   Current medicines are reviewed at length with the patient today.  The patient does not have concerns  regarding medicines.  The following changes have been made:  no change  Labs/ tests ordered today include:  No orders of the defined types were placed in this encounter.     Disposition:   FU with Tereso Newcomer, PA in 2 weeks  Signed, Donato Schultz, MD  01/27/2015 10:25 AM    [facility] [address] [place], [place], [place]  [address] Phone: [phone]; Fax: [phone]

## 2015-01-27 NOTE — Patient Instructions (Signed)
Medication Instructions:  Please increase Diltiazem to 360 mg a day.  A new prescription was sent into your pharmacy. Continue all other medications as listed.  Follow-Up: Follow up in 2 weeks with Tereso Newcomer, PA.  Thank you for choosing [facility]!!

## 2015-01-30 NOTE — Addendum Note (Signed)
Addended by: Reesa ChewJONES, Dontarious Schaum G on: 01/30/2015 11:55 AM   Modules accepted: Orders

## 2015-02-09 NOTE — Progress Notes (Signed)
Cardiology Office Note   Date:  02/10/2015   ID:  Sean [name], DOB [birth year], MRN [id number]  PCP:  Sean [name]  Cardiologist:  Dr. Donato Schultz   Electrophysiologist:  Dr. Lewayne Bunting   Chief Complaint  [name] presents with  . Follow-up  . Atrial Fibrillation     History of Present Illness: Sean [name] is a 54 y.o. male with a hx of PSVT, WPW status post ablation x 2 (Dr. Ladona Ridgel and then Dr. Sampson Goon at [facility]), PAF, morbid obesity, OSA s/p surgical repair.  Admitted in 6/16 with AFib/Flutter.  He converted to NSR with IV Diltiazem.  He was anticoagulated with Xarelto.  Echo demonstrated normal LVF. CHADS2-VASc=0.  He FU in the AFib Clinic. Recommendation was to continue anticoagulation for a total of 30 days given low TE risk factor profile.  He then established with Dr. Anne Fu in 9/16. Last seen 01/27/15. He was back in atrial fibrillation with a heart rate of 126. Diltiazem was increased to 360 mg daily. He was kept off anticoagulation. He returns for follow-up.  He is overall doing well.  The [name] denies chest pain, shortness of breath, syncope, orthopnea, PND or significant pedal edema.    Studies/Reports Reviewed Today:  Echo 6/16 EF 55-60%, mild LVH, mild dilation of aortic root, mod LAE, normal RVF  Cardiac CTA 04/2005 IMPRESSION: 1.  Normal anatomy to the coronary sinus.  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  Large posteromedial vein and large anterolateral vein also emanating normally from the great cardiac vein.   2.  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  The thebesian vein is not particularly prominent. 3.  Normal left ventricular cavity size and function.   4.  Calcium score of 0.   5.  Sean apparent coronary artery anomalies.  Circumflex coronary artery somewhat suboptimally  seen due to prominence of coronary sinus visualization. 6.  The [name] tolerated the procedure well.      Past Medical History  Diagnosis Date  . WPW (Wolff-Parkinson-White syndrome)     Epicardial posteroseptal accessory pathway - unsuccessful RFA 2007   . Atrial fibrillation (HCC)   . PSVT (paroxysmal supraventricular tachycardia) (HCC)     Orthodromic AVRT  . OSA (obstructive sleep apnea)     Past Surgical History  Procedure Laterality Date  . Tonsillectomy    . Oropharyngeal release surgery       Current Outpatient Prescriptions  Medication Sig Dispense Refill  . acetaminophen (TYLENOL) 325 MG tablet Take 650 mg by mouth every 6 (six) hours as needed for mild pain.    [name] diltiazem (CARDIZEM CD) 120 MG 24 hr capsule Take 3 capsules (360 mg total) by mouth daily. 90 capsule 6  . metoprolol tartrate (LOPRESSOR) 25 MG tablet Take 1 tablet (25 mg total) by mouth 2 (two) times daily. 60 tablet 11   Sean current facility-administered medications for this visit.    Allergies:   Review of [name]'s allergies indicates Sean known allergies.    Social History:  The [name]  reports that he has quit smoking. His smoking use included Cigarettes. He does not have any smokeless tobacco history on file. He reports that he does not drink alcohol or use illicit drugs.   Family History:  The [name]'s family history includes Atrial fibrillation in his father and mother.  ROS:   Please see the history of present illness.   Review of Systems  All other systems reviewed and are negative.     PHYSICAL EXAM: VS:  BP 108/80 mmHg  Pulse 121  Ht 6\' 2"  (1.88 m)  Wt 362 lb (164.202 kg)  BMI 46.46 kg/m2    Wt Readings from Last 3 Encounters:  02/10/15 362 lb (164.202 kg)  01/27/15 363 lb 12.8 oz (165.019 kg)  01/13/15 365 lb (165.563 kg)     GEN: Well nourished, well developed, in Sean acute distress HEENT: normal Neck: Sean JVD,   Sean masses Cardiac:  Normal S1/S2, irregularly irregular  rhythm; Sean murmur ,  Sean rubs or gallops, Sean edema   Respiratory:  clear to auscultation bilaterally, Sean wheezing, rhonchi or rales. GI: soft, nontender, nondistended, + BS MS: Sean deformity or atrophy Skin: warm and dry  Neuro:  CNs II-XII intact, Strength and sensation are intact Psych: Normal affect   EKG:  EKG is ordered today.  It demonstrates:   AFib, HR 121, Sean change   Recent Labs: 09/23/2014: ALT 31; Hemoglobin 13.9; Platelets 312; TSH 2.355 09/24/2014: BUN 14; Creatinine, Ser 1.07; Potassium 3.8; Sodium 138    Lipid Panel    Component Value Date/Time   CHOL 157 09/24/2014 0230   TRIG 111 09/24/2014 0230   HDL 27* 09/24/2014 0230   CHOLHDL 5.8 09/24/2014 0230   VLDL 22 09/24/2014 0230   LDLCALC 108* 09/24/2014 0230      ASSESSMENT AND PLAN:  1. PAF:  CHADS2-VASc=0.  Reviewed with Dr. Donato SchultzMark [name] today.  As we are pursuing a rate control strategy, he does not require anticoagulation.  Rate remains elevated.  Continue Diltiazem.  Start Metoprolol Tartrate 25 mg bid.  If rate remains uncontrolled in 2-3 weeks, start Digoxin.  If HR remains difficult to control may need to refer back to AFib Clinic to see if there are any antiarrhythmic options.  2. OSA:  He had uvula surgery many years ago.  He is convinced he does not snore. Given his weight, I would assume he is at risk of recurrent OSA.  He is not interested in pursuing sleep testing at this time.   3. Obesity:  He continues to work on weight loss.    4. WPW:   s/p prior Ablation.      Medication Changes: Current medicines are reviewed at length with the [name] today.  Concerns regarding medicines are as outlined above.  The following changes have been made:   Discontinued Medications   Sean medications on file   Modified Medications   Sean medications on file   New Prescriptions   METOPROLOL TARTRATE (LOPRESSOR) 25 MG TABLET    Take 1 tablet (25 mg total) by mouth 2 (two) times daily.   Labs/ tests ordered today  include:   Orders Placed This Encounter  Procedures  . EKG 12-Lead     Disposition:    FU with me in 2-3 weeks.     Signed, Brynda RimScott Weaver, PA-C, MHS 02/10/2015 9:57 AM    [facility] [address], [place], [place]  [address] Phone: [phone]; Fax: [phone]

## 2015-02-10 ENCOUNTER — Encounter: Payer: Self-pay | Admitting: Physician Assistant

## 2015-02-10 ENCOUNTER — Ambulatory Visit (INDEPENDENT_AMBULATORY_CARE_PROVIDER_SITE_OTHER): Payer: Self-pay | Admitting: Physician Assistant

## 2015-02-10 VITALS — BP 108/80 | HR 121 | Ht 74.0 in | Wt 362.0 lb

## 2015-02-10 DIAGNOSIS — I480 Paroxysmal atrial fibrillation: Secondary | ICD-10-CM

## 2015-02-10 DIAGNOSIS — Z8679 Personal history of other diseases of the circulatory system: Secondary | ICD-10-CM

## 2015-02-10 DIAGNOSIS — G4733 Obstructive sleep apnea (adult) (pediatric): Secondary | ICD-10-CM

## 2015-02-10 MED ORDER — METOPROLOL TARTRATE 25 MG PO TABS: 25.0000 mg | ORAL_TABLET | Freq: Two times a day (BID) | ORAL | Status: DC

## 2015-02-10 NOTE — Patient Instructions (Signed)
Medication Instructions:  1. START METOPROLOL TARTRATE 25 MG 1 TABLET TWICE DAILY; RX SENT   Labwork: NONE  Testing/Procedures: NONE  Follow-Up: 02/24/15 @ 3:40 SCOTT WEAVER, PAC  Any Other Special Instructions Will Be Listed Below (If Applicable).

## 2015-02-24 ENCOUNTER — Ambulatory Visit: Payer: Self-pay | Admitting: Physician Assistant

## 2015-07-18 ENCOUNTER — Other Ambulatory Visit: Payer: Self-pay | Admitting: Cardiology

## 2015-07-30 ENCOUNTER — Encounter: Payer: Self-pay | Admitting: Cardiology

## 2015-09-08 ENCOUNTER — Other Ambulatory Visit: Payer: Self-pay | Admitting: Cardiology

## 2015-09-16 ENCOUNTER — Other Ambulatory Visit: Payer: Self-pay

## 2015-09-16 DIAGNOSIS — I480 Paroxysmal atrial fibrillation: Secondary | ICD-10-CM

## 2015-09-16 MED ORDER — METOPROLOL TARTRATE 25 MG PO TABS: 25.0000 mg | ORAL_TABLET | Freq: Two times a day (BID) | ORAL | Status: DC

## 2015-11-23 ENCOUNTER — Other Ambulatory Visit: Payer: Self-pay | Admitting: Cardiology

## 2016-01-28 ENCOUNTER — Other Ambulatory Visit: Payer: Self-pay | Admitting: Cardiology

## 2016-01-28 DIAGNOSIS — I480 Paroxysmal atrial fibrillation: Secondary | ICD-10-CM

## 2016-03-30 ENCOUNTER — Other Ambulatory Visit: Payer: Self-pay | Admitting: Cardiology

## 2016-03-30 MED ORDER — DILTIAZEM HCL ER COATED BEADS 120 MG PO CP24: ORAL_CAPSULE | ORAL | 0 refills | Status: DC

## 2016-06-08 IMAGING — CR DG CHEST 1V PORT
1 series · 1 of 1 positions shown · non-contrast
Comparison: None.

CLINICAL DATA: Abdominal pain.  Recent cough.

EXAM:
PORTABLE CHEST - 1 VIEW

[ap portable]
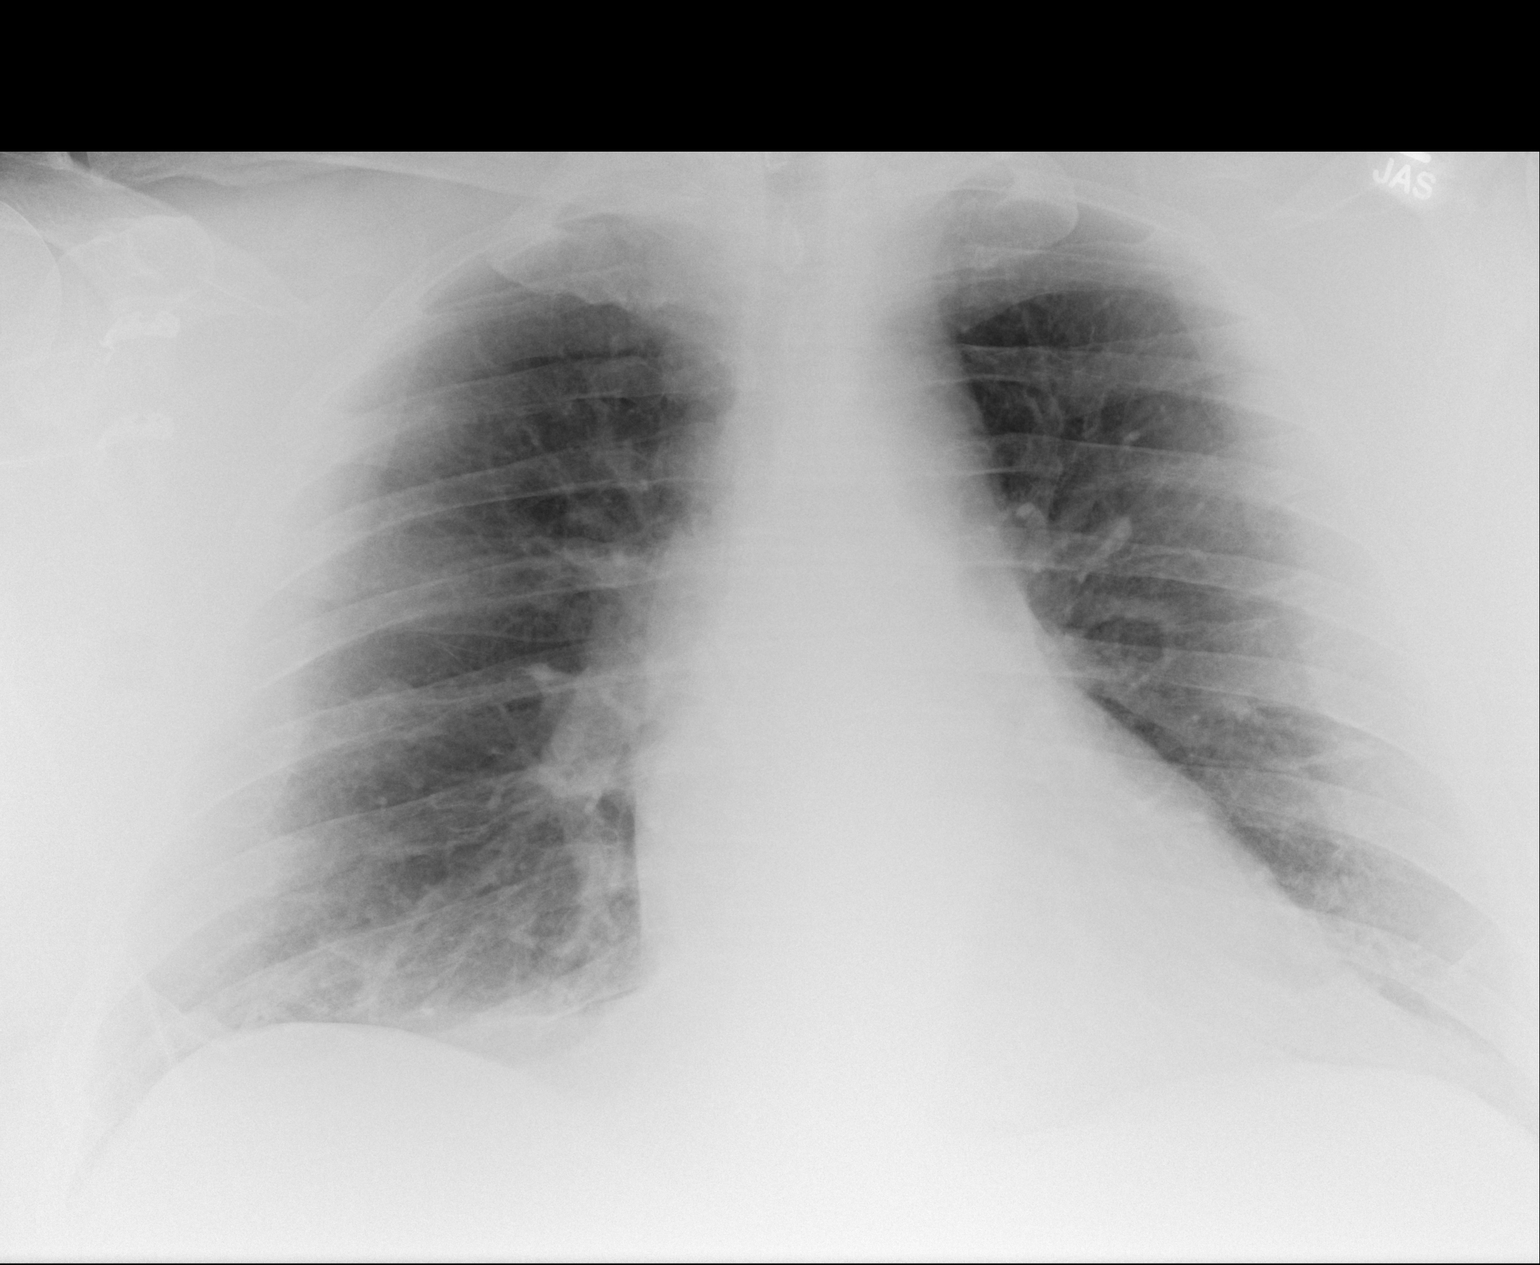

[1 of 1 positions shown; findings below may reference images not displayed]

FINDINGS: The heart size and mediastinal contours are within normal limits.
Both lungs are clear. The visualized skeletal structures are
unremarkable.
IMPRESSION: No active disease.

## 2016-07-13 ENCOUNTER — Telehealth: Payer: Self-pay | Admitting: Cardiology

## 2016-07-13 NOTE — Telephone Encounter (Signed)
OK with me.

## 2016-07-13 NOTE — Telephone Encounter (Signed)
Pt aware of policy for both MDs to review if OK to change physician's.  Will send to both Dr Anne FuSkains and Dr Antoine PocheHochrein for approval.  Sean Downsdvised he will be called call to schedule an appt.  Of note, pt cancelled his last appt 02/24/2015 and stated he would c/b to reschedule.  He is [facility]

## 2016-07-13 NOTE — Telephone Encounter (Signed)
Per pt would like to see Dr Antoine PocheHochrein after doing some home work and talking to Dr Gala RomneyBensimhon.  [facility].   Please give him a call back with in the next 2wks .

## 2016-07-14 NOTE — Telephone Encounter (Signed)
[facility]

## 2016-07-14 NOTE — Telephone Encounter (Signed)
Message taken to Beacher MayJean Miller to contact pt to schedule an appt with Dr Antoine [facility]Hochrein at [facility].

## 2016-07-14 NOTE — Telephone Encounter (Signed)
Okay with me as well. Mark [name], MD  

## 2016-07-15 ENCOUNTER — Encounter: Payer: Self-pay | Admitting: Cardiology

## 2016-07-29 NOTE — Progress Notes (Signed)
Cardiology Office Note   Date:  07/30/2016   ID:  Sean [name], DOB 05-09-1960, MRN [id number]  PCP:  No PCP Per Patient  Cardiologist:   Rollene Rotunda, MD    Chief Complaint  Patient presents with  . Atrial Fibrillation      History of Present Illness: Sean [name] is a 56 y.o. male who presents for follow up of PSVT and WPW status post ablation x 2 (Dr. Ladona Ridgel and then Dr. Sampson Goon at [facility]), PAF, morbid obesity, OSA s/p surgical repair.  He was last admitted in 6/16 with AFib/Flutter.  He converted to NSR with IV Diltiazem.  He was anticoagulated with Xarelto.  Echo demonstrated normal LVF. CHADS2-VASc=0.  He FU in the AFib Clinic. Recommendation was to continue anticoagulation for a total of 30 days.[name]  He was previously was seen by Dr. Anne Fu.    He denies any cardiovascular symptoms. He has significant stress taking care of his 41 year old dad.  He has had trouble taking meds but does comply with the meds as listed.  He does some walking but for the most part seems to be sedentary. He says he doesn't think he's in fibrillation over time. He says sometimes he feels great. He doesn't really notice palpitations and doesn't have any presyncope or syncope. He doesn't have any acute shortness of breath, PND or orthopnea. He has no weight gain or edema.   He then established with Dr. Anne Fu in 9/16. Last seen 01/27/15. He was back in atrial fibrillation with a heart rate of 126. Diltiazem was increased to 360 mg daily. He was kept off anticoagulation. He returns for follow-up.  He is overall doing well.  The patient denies chest pain, shortness of breath, syncope, orthopnea, PND or significant pedal edema.    Past Medical History:  Diagnosis Date  . Atrial fibrillation (HCC)   . OSA (obstructive sleep apnea)   . 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 WPW (Wolff-Parkinson-White syndrome)    Epicardial posteroseptal accessory  pathway - unsuccessful RFA 2007     Past Surgical History:  Procedure Laterality Date  . Oropharyngeal release surgery    . TONSILLECTOMY       Current Outpatient Prescriptions  Medication Sig Dispense Refill  . diltiazem (CARTIA XT) 120 MG 24 hr capsule TAKE THREE CAPSULES BY MOUTH ONCE DAILY 270 capsule 3  . metoprolol tartrate (LOPRESSOR) 25 MG tablet Take 1 tablet (25 mg total) by mouth 2 (two) times daily. 180 tablet 3  . acetaminophen (TYLENOL) 325 MG tablet Take 650 mg by mouth every 6 (six) hours as needed for mild pain.     No current facility-administered medications for this visit.     Allergies:   Patient has no known allergies.    ROS:  Please see the history of present illness.   Otherwise, review of systems are positive for Numbness in 2 spots on his feet.   All other systems are reviewed and negative.    PHYSICAL EXAM: VS:  BP 128/74   Pulse (!) 133   Ht  (1.88 m)   Wt (!) 373 lb (169.2 kg)   BMI 47.89 kg/m  , BMI Body mass index is 47.89 kg/m. GENERAL:  Well appearing HEENT:  Pupils equal round and reactive, fundi not visualized, oral mucosa unremarkable NECK:  No jugular venous distention, waveform within normal limits, carotid upstroke brisk and symmetric, no bruits, no thyromegaly LYMPHATICS:  No  cervical, inguinal adenopathy LUNGS:  Clear to auscultation bilaterally BACK:  No CVA tenderness CHEST:  Unremarkable HEART:  PMI not displaced or sustained,S1 and S2 within normal limits, no S3,  no clicks, no rubs, no murmurs, irregular ABD:  Flat, positive bowel sounds normal in frequency in pitch, no bruits, no rebound, no guarding, no midline pulsatile mass, morbidly obese,  no hepatomegaly, no splenomegaly EXT:  2 plus pulses throughout, no edema, no cyanosis no clubbing SKIN:  No rashes no nodules, rash on the hands.   NEURO:  Cranial nerves II through XII grossly intact, motor grossly intact throughout PSYCH:  Cognitively intact, oriented to  person place and time    EKG:  EKG is ordered today. The ekg ordered today demonstrates atrial fibrillation with rapid rate 133, axis within normal limits, intervals within normal limits, no acute ST-T wave changes.   Recent Labs: No results found for requested labs within last 8760 hours.    Lipid Panel    Component Value Date/Time   CHOL 157 09/24/2014 0230   TRIG 111 09/24/2014 0230   HDL 27 (L) 09/24/2014 0230   CHOLHDL 5.8 09/24/2014 0230   VLDL 22 09/24/2014 0230   LDLCALC 108 (H) 09/24/2014 0230      Wt Readings from Last 3 Encounters:  07/30/16 (!) 373 lb (169.2 kg)  02/10/15 (!) 362 lb (164.2 kg)  01/27/15 (!) 363 lb 12.8 oz (165 kg)      Other studies Reviewed: Additional studies/ records that were reviewed today include: Previous office visits. Review of the above records demonstrates:  Please see elsewhere in the note.     ASSESSMENT AND PLAN:   PAF:  Mr. Sean [name] has a CHA2DS2 - VASc score of 0.  I don't think ablation would be helpful in this long-standing patient significant risk factors for recurrent fibrillation. He needs rate control and we talked about this for a long time. He has strong opinions about medications and control of his rate but would comply wearing a 24-hour Holter monitor as I expect him going to find his rate is elevated we'll need to convince him to try different therapies for control of this.  SLEEP APNEA:  He said his surgery was curative. No change in therapy is planned.  OBESITY:  We talked about exercise for weight control. 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 Hopefully he can achieve some weight loss with a goal less than 300 pounds.  WPW:  This was successfully ablated. No change in therapy is planned.   Current medicines are reviewed at length with the patient today.  The patient does not have concerns regarding medicines.  The following changes have been made:  no change  Labs/ tests  ordered today include:   Orders Placed This Encounter  Procedures  . Holter monitor - 24 hour  . EKG 12-Lead     Disposition:   FU with me after the Holter.      Signed, Rollene Rotunda, MD  07/30/2016 2:00 PM    [facility]

## 2016-07-30 ENCOUNTER — Encounter: Payer: Self-pay | Admitting: Cardiology

## 2016-07-30 ENCOUNTER — Ambulatory Visit (INDEPENDENT_AMBULATORY_CARE_PROVIDER_SITE_OTHER): Payer: Self-pay | Admitting: Cardiology

## 2016-07-30 DIAGNOSIS — I4821 Permanent atrial fibrillation: Secondary | ICD-10-CM

## 2016-07-30 DIAGNOSIS — I482 Chronic atrial fibrillation: Secondary | ICD-10-CM

## 2016-07-30 MED ORDER — METOPROLOL TARTRATE 25 MG PO TABS: 25.0000 mg | ORAL_TABLET | Freq: Two times a day (BID) | ORAL | 3 refills | Status: DC

## 2016-07-30 MED ORDER — DILTIAZEM HCL ER COATED BEADS 120 MG PO CP24: ORAL_CAPSULE | ORAL | 3 refills | Status: DC

## 2016-07-30 NOTE — Patient Instructions (Signed)
Medication Instructions:   Refill sent to the pharmacy electronically.   Testing/Procedures:  Your physician has recommended that you wear a 24 HOUR holter monitor. Holter monitors are medical devices that record the heart's electrical activity. Doctors most often use these monitors to diagnose arrhythmias. Arrhythmias are problems with the speed or rhythm of the heartbeat. The monitor is a small, portable device. You can wear one while you do your normal daily activities. This is usually used to diagnose what is causing palpitations/syncope (passing out).    Follow-Up:  Your physician recommends that you schedule a follow-up appointment in: ONE MONTH WITH DR [facility]

## 2016-08-09 ENCOUNTER — Ambulatory Visit (INDEPENDENT_AMBULATORY_CARE_PROVIDER_SITE_OTHER): Payer: Self-pay

## 2016-08-09 DIAGNOSIS — I482 Chronic atrial fibrillation: Secondary | ICD-10-CM

## 2016-08-09 DIAGNOSIS — I4821 Permanent atrial fibrillation: Secondary | ICD-10-CM

## 2016-08-10 ENCOUNTER — Encounter: Payer: Self-pay | Admitting: Cardiology

## 2016-08-23 ENCOUNTER — Telehealth: Payer: Self-pay | Admitting: *Deleted

## 2016-08-23 DIAGNOSIS — I4821 Permanent atrial fibrillation: Secondary | ICD-10-CM

## 2016-08-23 MED ORDER — METOPROLOL TARTRATE 50 MG PO TABS: 50.0000 mg | ORAL_TABLET | Freq: Two times a day (BID) | ORAL | 3 refills | Status: DC

## 2016-08-23 NOTE — Telephone Encounter (Signed)
-----   Message from Rollene RotundaJames Hochrein, MD sent at 08/22/2016  7:34 PM EDT ----- Atrial fib permanent with rapid rate. Needs to increase his metoprolol to 50 mg bid po.  Call Mr. Ashley RoyaltyMatthews with the results.

## 2016-08-23 NOTE — Telephone Encounter (Signed)
Pt aware of his monitor, Metoprolol 50 mg twice a day #180 3 refills was send into pt pharmacy, pt is aware he have an appt on May 15th and to keep appt date and time.

## 2016-08-30 NOTE — Progress Notes (Signed)
Cardiology Office Note   Date:  08/31/2016   ID:  Sean [name], DOB 09/13/1960, MRN [id number]  PCP:  Patient, No Pcp Per  Cardiologist:   Rollene RotundaJames Romeo Zielinski, MD    Chief Complaint  Patient presents with  . Atrial Fibrillation      History of Present Illness: Sean [name] is a 56 y.o. male who presents for follow up of PSVT and WPW status post ablation x 2 (Dr. Ladona Ridgelaylor and then [facility]Fitzgerald [facility]), PAF, morbid obesity, OSA s/p surgical repair.  He was last admitted in 6/16 with AFib/Flutter.  He converted to NSR with IV Diltiazem.  He was anticoagulated with Xarelto.  Echo demonstrated normal LVF. CHADS2-VASc=0.  He FU in the AFib Clinic. Recommendation was to continue anticoagulation for a total of 30 days.  He was previously was seen by Dr. Anne FuSkains.  After the last visit with me he had a Holter that demonstrated rapid rates.  He was told to increase his beta blocker.  However, he is not taking his beta blocker.  He says he cannot.  It makes him nauseated.  The patient denies any new symptoms such as chest discomfort, neck or arm discomfort. There has been no new shortness of breath, PND or orthopnea. There have been no reported palpitations, presyncope or syncope.  Past Medical History:  Diagnosis Date  . Atrial fibrillation (HCC)   . OSA (obstructive sleep apnea)   . 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 WPW (Wolff-Parkinson-White syndrome)    Epicardial posteroseptal accessory pathway - unsuccessful RFA 2007     Past Surgical History:  Procedure Laterality Date  . Oropharyngeal release surgery    . TONSILLECTOMY       Current Outpatient Prescriptions  Medication Sig Dispense Refill  . acetaminophen (TYLENOL) 325 MG tablet Take 650 mg by mouth every 6 (six) hours as needed for mild pain.    [name]. diltiazem (CARTIA XT) 120 MG 24 hr capsule Take 1 capsule (120 mg total) by mouth 4 (four) times daily. 360 capsule 3   No current  facility-administered medications for this visit.     Allergies:   Metoprolol    ROS:  Please see the history of present illness.   Otherwise, review of systems are positive for none.   All other systems are reviewed and negative.    PHYSICAL EXAM: VS:  BP 136/90   Pulse 98   Ht 6\' 2"  (1.88 m)   Wt (!) 377 lb (171 kg)   BMI 48.40 kg/m  , BMI Body mass index is 48.4 kg/m.  GENERAL:  Well appearing NECK:  No jugular venous distention, waveform within normal limits, carotid upstroke brisk and symmetric, no bruits, no thyromegaly LUNGS:  Clear to auscultation bilaterally BACK:  No CVA tenderness CHEST:  Unremarkable HEART:  PMI not displaced or sustained,S1 and S2 within normal limits, no S3, no clicks, no rubs, no murmurs, irregular ABD:  Flat, positive bowel sounds normal in frequency in pitch, no bruits, no rebound, no guarding, no midline pulsatile mass, no hepatomegaly, no splenomegaly EXT:  2 plus pulses throughout, mild edema, no cyanosis no clubbing  EKG:  EKG is not ordered today.    Recent Labs: No results found for requested labs within last 8760 hours.    Lipid Panel    Component Value Date/Time   CHOL 157 09/24/2014 0230   TRIG 111 09/24/2014 0230   HDL 27 (L) 09/24/2014 0230  CHOLHDL 5.8 09/24/2014 0230   VLDL 22 09/24/2014 0230   LDLCALC 108 (H) 09/24/2014 0230      Wt Readings from Last 3 Encounters:  08/31/16 (!) 377 lb (171 kg)  07/30/16 (!) 373 lb (169.2 kg)  02/10/15 (!) 362 lb (164.2 kg)      Other studies Reviewed: Additional studies/ records that were reviewed today include: Holter Review of the above records demonstrates:     ASSESSMENT AND PLAN:   PAF:  Mr. Sean [name] has a CHA2DS2 - VASc score of 0.    I don't think ablation would be helpful in this long-standing patient significant risk factors for recurrent fibrillation.  He has very intolerances of many meds and will not take a beta blocker.  He understands that his heart  rate is not controlled and that this is a risk factor for heart failure and other issues.  He agrees to increase the Cardizem but would like to take 240 bid.  We might be able to use dig in the future.  He will get a pulse ox to follow up his BPs.  SLEEP APNEA:   He said his surgery was curative. No change in therapy is planned.  OBESITY:   We talked about exercise for weight control at the previous visi.  WPW:   This was successfully ablated. No change in therapy is planned.   Current medicines are reviewed at length with the patient today.  The patient does not have concerns regarding medicines.  The following changes have been made:  As above  Labs/ tests ordered today include:  None  No orders of the defined types were placed in this encounter.    Disposition:   FU with me in 3 months.      Signed, Rollene Rotunda, MD  08/31/2016 3:56 PM    [facility]

## 2016-08-31 ENCOUNTER — Encounter: Payer: Self-pay | Admitting: Cardiology

## 2016-08-31 ENCOUNTER — Ambulatory Visit (INDEPENDENT_AMBULATORY_CARE_PROVIDER_SITE_OTHER): Payer: Self-pay | Admitting: Cardiology

## 2016-08-31 DIAGNOSIS — I482 Chronic atrial fibrillation: Secondary | ICD-10-CM

## 2016-08-31 DIAGNOSIS — I4821 Permanent atrial fibrillation: Secondary | ICD-10-CM

## 2016-08-31 MED ORDER — DILTIAZEM HCL ER COATED BEADS 120 MG PO CP24: 120.0000 mg | ORAL_CAPSULE | Freq: Four times a day (QID) | ORAL | 3 refills | Status: DC

## 2016-08-31 NOTE — Patient Instructions (Signed)
Medication Instructions: Please start the Diltiazem (Cartia XT) 120 mg four times daily   Follow-Up: Your physician recommends that you schedule a follow-up appointment in: 3 months with Dr. Antoine PocheHochrein   If you need a refill on your cardiac medications before your next appointment, please call your pharmacy.   Please look into buying a pulse ox for home use

## 2016-12-06 NOTE — Progress Notes (Signed)
Cardiology Office Note   Date:  12/07/2016   ID:  Sean [name], DOB 1960-12-05, MRN [id number]  PCP:  Patient, No Pcp Per  Cardiologist:   Rollene Rotunda, MD    No chief complaint on file.     History of Present Illness: Sean [name] is a 56 y.o. male who presents for follow up of PSVT and WPW status post ablation x 2 (Dr. Ladona Ridgel and then Dr. Sampson Goon at [facility]), PAF, morbid obesity, OSA s/p surgical repair.  He was last admitted in 6/16 with AFib/Flutter.  He converted to NSR with IV Diltiazem.  He was anticoagulated with Xarelto.  Echo demonstrated normal LVF. CHADS2-VASc=0.  He FU in the AFib Clinic. Recommendation was to continue anticoagulation for a total of 30 days.  He was previously was seen by Dr. Anne Fu.  After a previous  visit with me he had a Holter that demonstrated rapid rates.  He was told to increase his beta blocker.  However, he is not taking his beta blocker.  He says he cannot.  It makes him nauseated.  I did, at the last appt convince him to take an increased dose of Cardizem.  He says that on 140 mg bid his heart rate is 80 to 110.  He denies any symptoms.  The patient denies any new symptoms such as chest discomfort, neck or arm discomfort. There has been no new shortness of breath, PND or orthopnea. There have been no reported palpitations, presyncope or syncope.   Past Medical History:  Diagnosis Date  . Atrial fibrillation (HCC)   . OSA (obstructive sleep apnea)   . 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 WPW (Wolff-Parkinson-White syndrome)    Epicardial posteroseptal accessory pathway - unsuccessful RFA 2007     Past Surgical History:  Procedure Laterality Date  . Oropharyngeal release surgery    . TONSILLECTOMY       Current Outpatient Prescriptions  Medication Sig Dispense Refill  . acetaminophen (TYLENOL) 325 MG tablet Take 650 mg by mouth every 6 (six) hours as needed for mild pain.    [name]  diltiazem (CARTIA XT) 120 MG 24 hr capsule Take 1 capsule (120 mg total) by mouth 4 (four) times daily. 360 capsule 3   No current facility-administered medications for this visit.     Allergies:   Metoprolol    ROS:  Please see the history of present illness.   Otherwise, review of systems are positive for none.   All other systems are reviewed and negative.    PHYSICAL EXAM: VS:  BP 132/88   Pulse (!) 108   Ht 6' (1.829 m)   Wt (!) 380 lb 3.2 oz (172.5 kg)   BMI 51.56 kg/m  , BMI Body mass index is 51.56 kg/m.  GENERAL:  Well appearing NECK:  No jugular venous distention, waveform within normal limits, carotid upstroke brisk and symmetric, no bruits, no thyromegaly LUNGS:  Clear to auscultation bilaterally BACK:  No CVA tenderness CHEST:  Unremarkable HEART:  PMI not displaced or sustained,S1 and S2 within normal limits, no S3,  no clicks, no rubs, no murmurs, irregular ABD:  Flat, positive bowel sounds normal in frequency in pitch, no bruits, no rebound, no guarding, no midline pulsatile mass, no hepatomegaly, no splenomegaly, morbidly obese.  EXT:  2 plus pulses throughout, no edema, no cyanosis no clubbing     EKG:  EKG is  not ordered today. Atrial fibrillation, leftward axis,  poor anterior R-wave progression, low voltage in the limb leads, no acute ST-T wave changes.   Recent Labs: No results found for requested labs within last 8760 hours.    Lipid Panel    Component Value Date/Time   CHOL 157 09/24/2014 0230   TRIG 111 09/24/2014 0230   HDL 27 (L) 09/24/2014 0230   CHOLHDL 5.8 09/24/2014 0230   VLDL 22 09/24/2014 0230   LDLCALC 108 (H) 09/24/2014 0230      Wt Readings from Last 3 Encounters:  12/07/16 (!) 380 lb 3.2 oz (172.5 kg)  08/31/16 (!) 377 lb (171 kg)  07/30/16 (!) 373 lb (169.2 kg)      Other studies Reviewed: Additional studies/ records that were reviewed today include: None Review of the above records demonstrates:     ASSESSMENT AND  PLAN:   PAF:  [name] has a CHA2DS2 - VASc score of 0.   His rate sounds like it is better.  I will add Digoxin and titrate this over time.    SLEEP APNEA:  He said his surgery was curative.    OBESITY:  We again talked about diet and exercise.   WPW:    He had this ablated.   Current medicines are reviewed at length with the patient today.  The patient does not have concerns regarding medicines.  The following changes have been made:  As above  Labs/ tests ordered today include:    No orders of the defined types were placed in this encounter.    Disposition:   FU with me in 3 months.      Signed, Rollene Rotunda, MD  12/07/2016 3:56 PM    [facility]

## 2016-12-07 ENCOUNTER — Encounter: Payer: Self-pay | Admitting: Cardiology

## 2016-12-07 ENCOUNTER — Ambulatory Visit (INDEPENDENT_AMBULATORY_CARE_PROVIDER_SITE_OTHER): Payer: Self-pay | Admitting: Cardiology

## 2016-12-07 VITALS — BP 132/88 | HR 108 | Ht 72.0 in | Wt 380.2 lb

## 2016-12-07 DIAGNOSIS — I482 Chronic atrial fibrillation, unspecified: Secondary | ICD-10-CM

## 2016-12-07 MED ORDER — DIGOXIN 125 MCG PO TABS: 0.1250 mg | ORAL_TABLET | Freq: Every day | ORAL | 3 refills | Status: DC

## 2016-12-07 NOTE — Patient Instructions (Signed)
Medication Instructions:  START- Digoxin 0.125 mg  Labwork: None Ordered  Testing/Procedures: None Ordered  Follow-Up: Your physician recommends that you schedule a follow-up appointment in: 3 Months.   Any Other Special Instructions Will Be Listed Below (If Applicable).   If you need a refill on your cardiac medications before your next appointment, please call your pharmacy.

## 2016-12-08 ENCOUNTER — Encounter: Payer: Self-pay | Admitting: Cardiology

## 2017-03-15 NOTE — Progress Notes (Signed)
Cardiology Office Note   Date:  03/16/2017   ID:  Sean ProudPhillip L [name], DOB 02/19/1961, MRN [id number]  PCP:  Patient, No Pcp Per  Cardiologist:   Rollene RotundaJames Shailee Foots, MD    Chief Complaint  Patient presents with  . Atrial Fibrillation      History of Present Illness: Sean [name] is a 56 y.o. male who presents for follow up of PSVT and WPW status post ablation x 2 (Dr. Ladona Ridgelaylor and then [facility]gerald [facility]), PAF, morbid obesity, OSA s/p surgical repair.  He was last admitted in 6/16 with AFib/Flutter.  He converted to NSR with IV Diltiazem.  He was anticoagulated with Xarelto.  Echo demonstrated normal LVF. CHADS2-VASc=1.  He followed up in the AFib Clinic.   He was previously was seen by Dr. Anne FuSkains.    Since I last saw him his father died.  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  He has less stress with this although there is stress with the estate details.  He thinks his heart rate is improved overall.  The patient denies any new symptoms such as chest discomfort, neck or arm discomfort. There has been no new shortness of breath, PND or orthopnea. There have been no reported palpitations, presyncope or syncope.   Past Medical History:  Diagnosis Date  . Atrial fibrillation (HCC)   . OSA (obstructive sleep apnea)   . 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 WPW (Wolff-Parkinson-White syndrome)    Epicardial posteroseptal accessory pathway - unsuccessful RFA 2007     Past Surgical History:  Procedure Laterality Date  . Oropharyngeal release surgery    . TONSILLECTOMY       Current Outpatient Medications  Medication Sig Dispense Refill  . acetaminophen (TYLENOL) 325 MG tablet Take 650 mg by mouth every 6 (six) hours as needed for mild pain.    [name]. digoxin (LANOXIN) 0.25 MG tablet Take 1 tablet (0.25 mg total) by mouth daily. 90 tablet 3  . diltiazem (CARTIA XT) 120 MG 24 hr capsule Take 1 capsule (120 mg total) by mouth 4 (four) times  daily. 360 capsule 3   No current facility-administered medications for this visit.     Allergies:   Metoprolol    ROS:  Please see the history of present illness.   Otherwise, review of systems are positive for none.   All other systems are reviewed and negative.    PHYSICAL EXAM: VS:  BP (!) 152/72   Pulse (!) 109   Ht 6' (1.829 m)   Wt (!) 391 lb 9.6 oz (177.6 kg)   BMI 53.11 kg/m  , BMI Body mass index is 53.11 kg/m.  GENERAL:  Well appearing NECK:  No jugular venous distention, waveform within normal limits, carotid upstroke brisk and symmetric, no bruits, no thyromegaly LUNGS:  Clear to auscultation bilaterally CHEST:  Unremarkable HEART:  PMI not displaced or sustained,S1 and S2 within normal limits, no S3, no clicks, no rubs, no murmurs, irregular ABD:  Flat, positive bowel sounds normal in frequency in pitch, no bruits, no rebound, no guarding, no midline pulsatile mass, no hepatomegaly, no splenomegaly, obese EXT:  2 plus pulses throughout, mild edema, no cyanosis no clubbing   EKG:  EKG is  ordered today. Atrial fibrillation, rate 109, LAD, non specific ST T wave changes.     Recent Labs: No results found for requested labs within last 8760 hours.    Lipid Panel  Component Value Date/Time   CHOL 157 09/24/2014 0230   TRIG 111 09/24/2014 0230   HDL 27 (L) 09/24/2014 0230   CHOLHDL 5.8 09/24/2014 0230   VLDL 22 09/24/2014 0230   LDLCALC 108 (H) 09/24/2014 0230      Wt Readings from Last 3 Encounters:  03/16/17 (!) 391 lb 9.6 oz (177.6 kg)  12/07/16 (!) 380 lb 3.2 oz (172.5 kg)  08/31/16 (!) 377 lb (171 kg)      Other studies Reviewed: Additional studies/ records that were reviewed today include: None Review of the above records demonstrates:     ASSESSMENT AND PLAN:   Permanent AF:  Mr. Sean [name] has a CHA2DS2 - VASc score of 0.   His rate is better but I would still like to increase his digoxin to 0.25 mg daily.  He has been  sensitive to meds.   HTN: BP is slightly elevated.  However, this is unusual.  No med changes are planned.  He needs weight loss and with therapeutic lifestyle changes.  SLEEP APNEA:   He said his surgery was curative.    OBESITY:  We have talked about strategies for weight loss in the past.   WPW:    He had this ablated.   Current medicines are reviewed at length with the patient today.  The patient does not have concerns regarding medicines.  The following changes have been made:  As above.   Labs/ tests ordered today include:   None  Orders Placed This Encounter  Procedures  . EKG 12-Lead     Disposition:   FU with me in 4  months.      Signed, Rollene RotundaJames Myles Mallicoat, MD  03/16/2017 3:26 PM    [facility]

## 2017-03-16 ENCOUNTER — Encounter: Payer: Self-pay | Admitting: Cardiology

## 2017-03-16 ENCOUNTER — Ambulatory Visit (INDEPENDENT_AMBULATORY_CARE_PROVIDER_SITE_OTHER): Payer: Self-pay | Admitting: Cardiology

## 2017-03-16 VITALS — BP 152/72 | HR 109 | Ht 72.0 in | Wt 391.6 lb

## 2017-03-16 DIAGNOSIS — I482 Chronic atrial fibrillation: Secondary | ICD-10-CM

## 2017-03-16 DIAGNOSIS — I4821 Permanent atrial fibrillation: Secondary | ICD-10-CM

## 2017-03-16 MED ORDER — DIGOXIN 250 MCG PO TABS: 0.2500 mg | ORAL_TABLET | Freq: Every day | ORAL | 3 refills | Status: DC

## 2017-03-16 NOTE — Patient Instructions (Signed)
Medication Instructions:  INCREASE- Digoxin 0.25 mg daily  If you need a refill on your cardiac medications before your next appointment, please call your pharmacy.  Labwork: None Ordered   Testing/Procedures: None Ordered  Follow-Up: Your physician wants you to follow-up in: 4 Months.    Thank you for choosing [facility] at [facility]!!

## 2017-07-12 ENCOUNTER — Ambulatory Visit: Payer: Self-pay | Admitting: Cardiology

## 2017-08-24 NOTE — Progress Notes (Signed)
Cardiology Office Note   Date:  08/25/2017   ID:  Sean [name], DOB 1960/05/27, MRN [id number]  PCP:  Patient, No Pcp Per  Cardiologist:   Rollene Rotunda, MD    Chief Complaint  Patient presents with  . Atrial Fibrillation      History of Present Illness: Sean [name] is a 57 y.o. male who presents for follow up of PSVT and WPW status post ablation x 2 (Dr. Ladona Ridgel and then Dr. Sampson Goon at [facility]), PAF, morbid obesity, OSA s/p surgical repair.  He was last admitted in 6/16 with AFib/Flutter.  He converted to NSR with IV Diltiazem.  He was anticoagulated with Xarelto.  Echo demonstrated normal LVF. CHADS2-VASc=1.  He followed up in the [facility].   He was previously was seen by Dr. Anne Fu.    Since I last saw him he has been doing very well.  He has dieted and he is lost several pounds.  He is not feeling palpitations.  He is being active in his yard. The patient denies any new symptoms such as chest discomfort, neck or arm discomfort. There has been no new shortness of breath, PND or orthopnea. There have been no reported palpitations, presyncope or syncope.   Past Medical History:  Diagnosis Date  . Atrial fibrillation (HCC)   . OSA (obstructive sleep apnea)   . 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 WPW (Wolff-Parkinson-White syndrome)    Epicardial posteroseptal accessory pathway - unsuccessful RFA 2007     Past Surgical History:  Procedure Laterality Date  . Oropharyngeal release surgery    . TONSILLECTOMY       Current Outpatient Medications  Medication Sig Dispense Refill  . acetaminophen (TYLENOL) 325 MG tablet Take 650 mg by mouth every 6 (six) hours as needed for mild pain.    [name] digoxin (LANOXIN) 0.25 MG tablet Take 1 tablet (0.25 mg total) by mouth daily. 90 tablet 3  . diltiazem (CARTIA XT) 120 MG 24 hr capsule Take 1 capsule (120 mg total) by mouth 4 (four) times daily. 360 capsule 3   No current  facility-administered medications for this visit.     Allergies:   Metoprolol    ROS:  Please see the history of present illness.   Otherwise, review of systems are positive for none.   All other systems are reviewed and negative.    PHYSICAL EXAM: VS:  BP 132/76   Pulse 81   Ht 6' (1.829 m)   Wt (!) 375 lb 12.8 oz (170.5 kg)   BMI 50.97 kg/m  , BMI Body mass index is 50.97 kg/m.  GENERAL:  Well appearing NECK:  No jugular venous distention, waveform within normal limits, carotid upstroke brisk and symmetric, no bruits, no thyromegaly LUNGS:  Clear to auscultation bilaterally CHEST:  Unremarkable HEART:  PMI not displaced or sustained,S1 and S2 within normal limits, no S3,  no clicks, no rubs, no murmurs, irregular ABD:  Flat, positive bowel sounds normal in frequency in pitch, no bruits, no rebound, no guarding, no midline pulsatile mass, no hepatomegaly, no splenomegaly EXT:  2 plus pulses throughout, trace edema, no cyanosis no clubbing SKIN:  Eczema.     EKG:  EKG is   ordered today.  atrial fibrillation, rate 81, leftward axis, no acute ST-T wave changes.    Recent Labs: No results found for requested labs within last 8760 hours.     Wt Readings from Last 3 Encounters:  08/25/17 (!) 375 lb 12.8 oz (170.5 kg)  03/16/17 (!) 391 lb 9.6 oz (177.6 kg)  12/07/16 (!) 380 lb 3.2 oz (172.5 kg)      Other studies Reviewed: Additional studies/ records that were reviewed today include: None Review of the above records demonstrates:     ASSESSMENT AND PLAN:   Permanent AF:  Sean [name] has a CHA2DS2 - VASc score of   0.   I will order a dig level.  No change in therapy at this time.   HTN: BP is controlled.  No change in therapy.   SLEEP APNEA:   Surgery was curative per his report.  OBESITY:   I agree with his current weight loss which emphasizes food choices.   WPW:   He had this ablated.  RISK REDUCTION:  I will check labs to include lipids.    Current medicines are reviewed at length with the patient today.  The patient does not have concerns regarding medicines.  The following changes have been made:  As above.   Labs/ tests ordered today include:   None  Orders Placed This Encounter  Procedures  . CBC  . TSH  . Lipid panel  . Digoxin level  . Basic Metabolic Panel (BMET)  . EKG 12-Lead     Disposition:   FU with me in 12 months.      Signed, Rollene Rotunda, MD  08/25/2017 10:50 AM    [facility]

## 2017-08-25 ENCOUNTER — Encounter: Payer: Self-pay | Admitting: Cardiology

## 2017-08-25 ENCOUNTER — Ambulatory Visit (INDEPENDENT_AMBULATORY_CARE_PROVIDER_SITE_OTHER): Payer: Self-pay | Admitting: Cardiology

## 2017-08-25 VITALS — BP 132/76 | HR 81 | Ht 72.0 in | Wt 375.8 lb

## 2017-08-25 DIAGNOSIS — Z79899 Other long term (current) drug therapy: Secondary | ICD-10-CM

## 2017-08-25 DIAGNOSIS — I480 Paroxysmal atrial fibrillation: Secondary | ICD-10-CM

## 2017-08-25 DIAGNOSIS — E785 Hyperlipidemia, unspecified: Secondary | ICD-10-CM

## 2017-08-25 NOTE — Patient Instructions (Signed)
Medication Instructions:  Continue current medications  If you need a refill on your cardiac medications before your next appointment, please call your pharmacy.  Labwork: CBC, BMP, TSH, Fasting Lipids and Digoxin Level HERE IN OUR OFFICE AT [facility]  Take the provided lab slips with you to the lab for your blood draw.   You will need to fast. DO NOT EAT OR DRINK PAST MIDNIGHT.   Testing/Procedures: None Ordered   Follow-Up: Your physician wants you to follow-up in: 1 Year. You should receive a reminder letter in the mail two months in advance. If you do not receive a letter, please call our office [phone].      Thank you for choosing [facility] at [facility]!!

## 2017-09-24 ENCOUNTER — Other Ambulatory Visit: Payer: Self-pay | Admitting: Cardiology

## 2018-02-24 ENCOUNTER — Emergency Department (HOSPITAL_COMMUNITY)
Admission: EM | Admit: 2018-02-24 | Discharge: 2018-02-24 | Disposition: A | Discharge status: Home / Self Care | Payer: Self-pay | Attending: Emergency Medicine | Admitting: Emergency Medicine

## 2018-02-24 ENCOUNTER — Emergency Department (HOSPITAL_COMMUNITY): Payer: Self-pay

## 2018-02-24 ENCOUNTER — Encounter (HOSPITAL_COMMUNITY): Payer: Self-pay | Admitting: Emergency Medicine

## 2018-02-24 DIAGNOSIS — N200 Calculus of kidney: Secondary | ICD-10-CM | POA: Insufficient documentation

## 2018-02-24 DIAGNOSIS — Z87891 Personal history of nicotine dependence: Secondary | ICD-10-CM | POA: Insufficient documentation

## 2018-02-24 DIAGNOSIS — R109 Unspecified abdominal pain: Secondary | ICD-10-CM

## 2018-02-24 LAB — URINALYSIS, ROUTINE W REFLEX MICROSCOPIC
Bilirubin Urine: NEGATIVE
Glucose, UA: NEGATIVE mg/dL
Hgb urine dipstick: NEGATIVE
KETONES UR: NEGATIVE mg/dL
LEUKOCYTES UA: NEGATIVE
NITRITE: NEGATIVE
PROTEIN: NEGATIVE mg/dL
Specific Gravity, Urine: 1.02 (ref 1.005–1.030)
pH: 6 (ref 5.0–8.0)

## 2018-02-24 MED ORDER — TAMSULOSIN HCL 0.4 MG PO CAPS: 0.4000 mg | ORAL_CAPSULE | Freq: Every day | ORAL | 0 refills | Status: AC

## 2018-02-24 MED ORDER — KETOROLAC TROMETHAMINE 30 MG/ML IJ SOLN
30.0000 mg | Freq: Once | INTRAMUSCULAR | Status: AC
  Administered 2018-02-24: 30 mg via INTRAMUSCULAR
  Filled 2018-02-24: qty 1

## 2018-02-24 MED ORDER — KETOROLAC TROMETHAMINE 10 MG PO TABS: 10.0000 mg | ORAL_TABLET | Freq: Four times a day (QID) | ORAL | 0 refills | Status: DC | PRN

## 2018-02-24 MED ORDER — OXYCODONE-ACETAMINOPHEN 5-325 MG PO TABS: 1.0000 | ORAL_TABLET | Freq: Four times a day (QID) | ORAL | 0 refills | Status: DC | PRN

## 2018-02-24 MED ORDER — OXYCODONE-ACETAMINOPHEN 5-325 MG PO TABS: 2.0000 | ORAL_TABLET | Freq: Four times a day (QID) | ORAL | 0 refills | Status: DC | PRN

## 2018-02-24 MED ORDER — KETOROLAC TROMETHAMINE 10 MG PO TABS: 10.0000 mg | ORAL_TABLET | Freq: Four times a day (QID) | ORAL | 0 refills | Status: AC | PRN

## 2018-02-24 MED ORDER — OXYCODONE-ACETAMINOPHEN 5-325 MG PO TABS
2.0000 | ORAL_TABLET | Freq: Once | ORAL | Status: AC
  Administered 2018-02-24: 2 via ORAL
  Filled 2018-02-24: qty 2

## 2018-02-24 NOTE — Discharge Instructions (Signed)
You have been seen in the Emergency Department (ED) today for pain that we believe based on your workup, is caused by kidney stones.  As we have discussed, please drink plenty of fluids.  Please make a follow up appointment with the physician(s) listed elsewhere in this documentation. ° °You may take pain medication as needed but ONLY as prescribed.  Please also take your prescribed Flomax daily.  We also recommend that you take over-the-counter ibuprofen regularly according to label instructions over the next 5 days.  Take it with meals to minimize stomach discomfort. ° °Please see your doctor as soon as possible as stones may take 1-3 weeks to pass and you may require additional care or medications. ° °Do not drink alcohol, drive or participate in any other potentially dangerous activities while taking opiate pain medication as it may make you sleepy. Do not take this medication with any other sedating medications, either prescription or over-the-counter. If you were prescribed Percocet or Vicodin, do not take these with acetaminophen (Tylenol) as it is already contained within these medications. °  °Take Percocet as needed for severe pain.  This medication is an opiate (or narcotic) pain medication and can be habit forming.  Use it as little as possible to achieve adequate pain control.  Do not use or use it with extreme caution if you have a history of opiate abuse or dependence.  If you are on a pain contract with your primary care doctor or a pain specialist, be sure to let them know you were prescribed this medication today from the Emergency Department.  This medication is intended for your use only - do not give any to anyone else and keep it in a secure place where nobody else, especially children, have access to it.  It will also cause or worsen constipation, so you may want to consider taking an over-the-counter stool softener while you are taking this medication. ° °Return to the Emergency Department  (ED) or call your doctor if you have any worsening pain, fever, painful urination, are unable to urinate, or develop other symptoms that concern you. ° ° ° °Kidney Stones °Kidney stones (urolithiasis) are deposits that form inside your kidneys. The intense pain is caused by the stone moving through the urinary tract. When the stone moves, the ureter goes into spasm around the stone. The stone is usually passed in the urine.  °CAUSES  °A disorder that makes certain neck glands produce too much parathyroid hormone (primary hyperparathyroidism). °A buildup of uric acid crystals, similar to gout in your joints. °Narrowing (stricture) of the ureter. °A kidney obstruction present at birth (congenital obstruction). °Previous surgery on the kidney or ureters. °Numerous kidney infections. °SYMPTOMS  °Feeling sick to your stomach (nauseous). °Throwing up (vomiting). °Blood in the urine (hematuria). °Pain that usually spreads (radiates) to the groin. °Frequency or urgency of urination. °DIAGNOSIS  °Taking a history and physical exam. °Blood or urine tests. °CT scan. °Occasionally, an examination of the inside of the urinary bladder (cystoscopy) is performed. °TREATMENT  °Observation. °Increasing your fluid intake. °Extracorporeal shock wave lithotripsy--This is a noninvasive procedure that uses shock waves to break up kidney stones. °Surgery may be needed if you have severe pain or persistent obstruction. There are various surgical procedures. Most of the procedures are performed with the use of small instruments. Only small incisions are needed to accommodate these instruments, so recovery time is minimized. °The size, location, and chemical composition are all important variables that will determine the proper   choice of action for you. Talk to your health care provider to better understand your situation so that you will minimize the risk of injury to yourself and your kidney.  °HOME CARE INSTRUCTIONS  °Drink enough water  and fluids to keep your urine clear or pale yellow. This will help you to pass the stone or stone fragments. °Strain all urine through the provided strainer. Keep all particulate matter and stones for your health care provider to see. The stone causing the pain may be as small as a grain of salt. It is very important to use the strainer each and every time you pass your urine. The collection of your stone will allow your health care provider to analyze it and verify that a stone has actually passed. The stone analysis will often identify what you can do to reduce the incidence of recurrences. °Only take over-the-counter or prescription medicines for pain, discomfort, or fever as directed by your health care provider. °Keep all follow-up visits as told by your health care provider. This is important. °Get follow-up X-rays if required. The absence of pain does not always mean that the stone has passed. It may have only stopped moving. If the urine remains completely obstructed, it can cause loss of kidney function or even complete destruction of the kidney. It is your responsibility to make sure X-rays and follow-ups are completed. Ultrasounds of the kidney can show blockages and the status of the kidney. Ultrasounds are not associated with any radiation and can be performed easily in a matter of minutes. °Make changes to your daily diet as told by your health care provider. You may be told to: °Limit the amount of salt that you eat. °Eat 5 or more servings of fruits and vegetables each day. °Limit the amount of meat, poultry, fish, and eggs that you eat. °Collect a 24-hour urine sample as told by your health care provider. You may need to collect another urine sample every 6-12 months. °SEEK MEDICAL CARE IF: °You experience pain that is progressive and unresponsive to any pain medicine you have been prescribed. °SEEK IMMEDIATE MEDICAL CARE IF:  °Pain cannot be controlled with the prescribed medicine. °You have a  fever or shaking chills. °The severity or intensity of pain increases over 18 hours and is not relieved by pain medicine. °You develop a new onset of abdominal pain. °You feel faint or pass out. °You are unable to urinate. °  °This information is not intended to replace advice given to you by your health care provider. Make sure you discuss any questions you have with your health care provider. °  °Document Released: 04/05/2005 Document Revised: 12/25/2014 Document Reviewed: 09/06/2012 °Elsevier Interactive Patient Education ©2016 [employer] ° ° °

## 2018-02-24 NOTE — ED Provider Notes (Signed)
Emergency Department Provider Note   I have reviewed the triage vital signs and the nursing notes.   HISTORY  Chief Complaint Flank Pain   HPI Sean [name] is a 57 y.o. male with PMH of PSVT, A-fib, kidney stones, and elevated BMI presents to the emergency department for evaluation of severe left flank pain radiating to the groin.  He has no pain with urination or hematuria.  Patient states initially thought he may be dealing with a pulled muscle but pain is become more severe.  He states it does feel more like a kidney stone now the pain is increased.  Denies any fevers or chills.  No epigastric or right sided abdominal discomfort.  No radiation of symptoms into the chest or difficulty breathing.  He has never required intervention on the kidney stone by urologist.    Past Medical History:  Diagnosis Date  . Atrial fibrillation (HCC)   . OSA (obstructive sleep apnea)   . 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 WPW (Wolff-Parkinson-White syndrome)    Epicardial posteroseptal accessory pathway - unsuccessful RFA 2007     Patient Active Problem List   Diagnosis Date Noted  . Paroxysmal atrial fibrillation (HCC) 01/13/2015  . Morbid obesity (HCC) 01/13/2015  . History of Wolff-Parkinson-White (WPW) syndrome 01/13/2015  . Atrial flutter (HCC) 09/23/2014    Past Surgical History:  Procedure Laterality Date  . Oropharyngeal release surgery    . TONSILLECTOMY      Allergies Ciprofloxacin; Levaquin [levofloxacin]; and Metoprolol  Family History  Problem Relation Age of Onset  . Atrial fibrillation Father   . Atrial fibrillation Mother     Social History Social History   Tobacco Use  . Smoking status: Former Smoker    Types: Cigarettes  . Smokeless tobacco: Never Used  Substance Use Topics  . Alcohol use: No    Alcohol/week: 0.0 standard drinks  . Drug use: No    Review of Systems  Constitutional: No  fever/chills Eyes: No visual changes. ENT: No sore throat. Cardiovascular: Denies chest pain. Respiratory: Denies shortness of breath. Gastrointestinal: Positive left flank/abdominal pain.  No nausea, no vomiting.  No diarrhea.  No constipation. Genitourinary: Negative for dysuria. Musculoskeletal: Negative for back pain. Skin: Negative for rash. Neurological: Negative for headaches, focal weakness or numbness.  10-point ROS otherwise negative.  ____________________________________________   PHYSICAL EXAM:  VITAL SIGNS: ED Triage Vitals  Enc Vitals Group     BP 02/24/18 1225 110/77     Pulse Rate 02/24/18 1225 87     Resp 02/24/18 1225 18     Temp 02/24/18 1225 98.7 F (37.1 C)     Temp Source 02/24/18 1225 Oral     SpO2 02/24/18 1225 98 %     Pain Score 02/24/18 1223 4   Constitutional: Alert and oriented. Well appearing and in no acute distress. Eyes: Conjunctivae are normal. Head: Atraumatic. Nose: No congestion/rhinnorhea. Mouth/Throat: Mucous membranes are moist.   Neck: No stridor. Cardiovascular: Normal rate, regular rhythm. Good peripheral circulation. Grossly normal heart sounds.   Respiratory: Normal respiratory effort.  No retractions. Lungs CTAB. Gastrointestinal: Soft and nontender. Moderate CVA tenderness to percussion. No distention.  Musculoskeletal: No lower extremity tenderness nor edema. No gross deformities of extremities. Neurologic:  Normal speech and language. No gross focal neurologic deficits are appreciated.  Skin:  Skin is warm, dry and intact. No rash noted.  ____________________________________________   LABS (all labs ordered are listed, but only  abnormal results are displayed)  Labs Reviewed  URINE CULTURE  URINALYSIS, ROUTINE W REFLEX MICROSCOPIC   ____________________________________________  RADIOLOGY  Ct Renal Stone Study  Result Date: 02/24/2018 CLINICAL DATA:  Evaluation of acute left flank pain. Personal history of kidney  stones. EXAM: CT ABDOMEN AND PELVIS WITHOUT CONTRAST TECHNIQUE: Multidetector CT imaging of the abdomen and pelvis was performed following the standard protocol without IV contrast. COMPARISON:  None. FINDINGS: Lower chest: Normal Hepatobiliary: Normal Pancreas: Normal Spleen: Normal Adrenals/Urinary Tract: Adrenal glands are normal. The right kidney is normal except for a cyst at the upper pole measuring 4.5 cm in diameter. The left kidney shows moderate atrophy. No sign of cyst, mass, stone or hydronephrosis. However, in the left ureter at the pelvic brim level, there is a 6 mm stone which despite its size does not appear to be causing hydroureteronephrosis. No stone in the bladder. Stomach/Bowel: Normal Vascular/Lymphatic: Minimal aortic atherosclerosis. IVC is normal. No retroperitoneal adenopathy. Reproductive: Normal Other: No free fluid or air. Musculoskeletal: Mild lower lumbar degenerative changes. IMPRESSION: The left kidney shows moderate atrophy. There is a 6 mm stone in the left ureter at the pelvic brim level. Despite the size of this stone, there does not appear to be any hydroureteronephrosis. Electronically Signed   By: Paulina Fusi M.D.   On: 02/24/2018 14:20    ____________________________________________   PROCEDURES  Procedure(s) performed:   Procedures  None ____________________________________________   INITIAL IMPRESSION / ASSESSMENT AND PLAN / ED COURSE  Pertinent labs & imaging results that were available during my care of the patient were reviewed by me and considered in my medical decision making (see chart for details).  Patient presents to the emergency department for evaluation of left flank pain.  States it feels similar to prior kidney stones.  Plan for Toradol here along with CT renal and urinalysis.   2:38 PM CT renal which shows kidney stone at the left pelvic brim.  This almost exactly approximates the patient's area of discomfort.  No evidence of  infection on urinalysis.  His pain is reasonably well controlled here in the emergency department.  Plan for pain control at home along with Flomax and Urology follow up.   At this time, I do not feel there is any life-threatening condition present. I have reviewed and discussed all results (EKG, imaging, lab, urine as appropriate), exam findings with patient. I have reviewed nursing notes and appropriate previous records.  I feel the patient is safe to be discharged home without further emergent workup. Discussed usual and customary return precautions. Patient and family (if present) verbalize understanding and are comfortable with this plan.  Patient will follow-up with their primary care provider. If they do not have a primary care provider, information for follow-up has been provided to them. All questions have been answered.  ____________________________________________  FINAL CLINICAL IMPRESSION(S) / ED DIAGNOSES  Final diagnoses:  Kidney stone  Left flank pain     MEDICATIONS GIVEN DURING THIS VISIT:  Medications  oxyCODONE-acetaminophen (PERCOCET/ROXICET) 5-325 MG per tablet 2 tablet (has no administration in time range)  ketorolac (TORADOL) 30 MG/ML injection 30 mg (30 mg Intramuscular Given 02/24/18 1240)     NEW OUTPATIENT MEDICATIONS STARTED DURING THIS VISIT:  New Prescriptions   KETOROLAC (TORADOL) 10 MG TABLET    Take 1 tablet (10 mg total) by mouth every 6 (six) hours as needed for up to 5 days for moderate pain.   OXYCODONE-ACETAMINOPHEN (PERCOCET/ROXICET) 5-325 MG TABLET    Take  1 tablet by mouth every 6 (six) hours as needed for severe pain.   TAMSULOSIN (FLOMAX) 0.4 MG CAPS CAPSULE    Take 1 capsule (0.4 mg total) by mouth daily for 7 days.    Note:  This document was prepared using Dragon voice recognition software and may include unintentional dictation errors.  Alona Bene, MD Emergency Medicine    Zuri Lascala, Arlyss Repress, MD 02/24/18 [phone]

## 2018-02-24 NOTE — ED Triage Notes (Addendum)
Pt here for eval of left flank pain. No pain with urination. Pt has had the pain for several days, originally thought he had pulled a muscle. Normal BMs. Pt states "it is a kidney stone, I have had several before."

## 2018-02-25 LAB — URINE CULTURE
Culture: NO GROWTH
Special Requests: NORMAL

## 2018-06-06 ENCOUNTER — Other Ambulatory Visit: Payer: Self-pay | Admitting: Cardiology

## 2018-10-12 ENCOUNTER — Other Ambulatory Visit: Payer: Self-pay | Admitting: Cardiology

## 2018-11-01 ENCOUNTER — Telehealth: Payer: Self-pay | Admitting: Cardiology

## 2018-11-01 NOTE — Telephone Encounter (Signed)
I called pt to confirm his appt with Dr Percival Spanish on 11-02-18 .        1. Confirm consent - "In the setting of the current Covid19 crisis, you are scheduled for a (phone or video) visit with your provider on (date) at (time).  Just as we do with many in-office visits, in order for you to participate in this visit, we must obtain consent.  If you'd like, I can send this to your mychart (if signed up) or email for you to review.  Otherwise, I can obtain your verbal consent now.  All virtual visits are billed to your insurance company just like a normal visit would be.  By agreeing to a virtual visit, we'd like you to understand that the technology does not allow for your provider to perform an examination, and thus may limit your provider's ability to fully assess your condition. If your provider identifies any concerns that need to be evaluated in person, we will make arrangements to do so.  Finally, though the technology is pretty good, we cannot assure that it will always work on either your or our end, and in the setting of a video visit, we may have to convert it to a phone-only visit.  In either situation, we cannot ensure that we have a secure connection.  Are you willing to proceed?" STAFF: Did the patient verbally acknowledge consent to telehealth visit? Document YES/NO here:  Yes        FULL LENGTH CONSENT FOR TELE-HEALTH VISIT   I hereby voluntarily request, consent and authorize [facility] and its employed or contracted physicians, physician assistants, nurse practitioners or other licensed health care professionals (the Practitioner), to provide me with telemedicine health care services (the Services") as deemed necessary by the treating Practitioner. I acknowledge and consent to receive the Services by the Practitioner via telemedicine. I understand that the telemedicine visit will involve communicating with the Practitioner through live audiovisual communication technology and the  disclosure of certain medical information by electronic transmission. I acknowledge that I have been given the opportunity to request an in-person assessment or other available alternative prior to the telemedicine visit and am voluntarily participating in the telemedicine visit.  I understand that I have the right to withhold or withdraw my consent to the use of telemedicine in the course of my care at any time, without affecting my right to future care or treatment, and that the Practitioner or I may terminate the telemedicine visit at any time. I understand that I have the right to inspect all information obtained and/or recorded in the course of the telemedicine visit and may receive copies of available information for a reasonable fee.  I understand that some of the potential risks of receiving the Services via telemedicine include:   Delay or interruption in medical evaluation due to technological equipment failure or disruption;  Information transmitted may not be sufficient (e.g. poor resolution of images) to allow for appropriate medical decision making by the Practitioner; and/or   In rare instances, security protocols could fail, causing a breach of personal health information.  Furthermore, I acknowledge that it is my responsibility to provide information about my medical history, conditions and care that is complete and accurate to the best of my ability. I acknowledge that Practitioner's advice, recommendations, and/or decision may be based on factors not within their control, such as incomplete or inaccurate data provided by me or distortions of diagnostic images or specimens that may result from electronic transmissions.  I understand that the practice of medicine is not an exact science and that Practitioner makes no warranties or guarantees regarding treatment outcomes. I acknowledge that I will receive a copy of this consent concurrently upon execution via email to the email address I  last provided but may also request a printed copy by calling the office of [facility].    I understand that my insurance will be billed for this visit.   I have read or had this consent read to me.  I understand the contents of this consent, which adequately explains the benefits and risks of the Services being provided via telemedicine.   I have been provided ample opportunity to ask questions regarding this consent and the Services and have had my questions answered to my satisfaction.  I give my informed consent for the services to be provided through the use of telemedicine in my medical care  By participating in this telemedicine visit I agree to the above.

## 2018-11-01 NOTE — Progress Notes (Signed)
Virtual Visit via Telephone Note   This visit type was conducted due to national recommendations for restrictions regarding the COVID-19 Pandemic (e.g. social distancing) in an effort to limit this patient's exposure and mitigate transmission in our community.  Due to his co-morbid illnesses, this patient is at least at moderate risk for complications without adequate follow up.  This format is felt to be most appropriate for this patient at this time.  The patient did not have access to video technology/had technical difficulties with video requiring transitioning to audio format only (telephone).  All issues noted in this document were discussed and addressed.  No physical exam could be performed with this format.  Please refer to the patient's chart for his  consent to telehealth for [facility].   Date:  11/02/2018   ID:  Sean [name], DOB 1961-01-19, MRN [id number]  Patient Location: Home Provider Location: Home  PCP:  Patient, No Pcp Per  Cardiologist:  Minus Breeding, MD  Electrophysiologist:  None   Evaluation Performed:  Follow-Up Visit  Chief Complaint:  Atrial Fib   History of Present Illness:    Sean [name] is a 58 y.o. male who presents for follow up of PSVT and WPW status post ablation x 2 (Dr. Lovena Le and then Dr. Ola Spurr at [facility]), PAF, morbid obesity, OSA s/p surgical repair. He was last admitted in 6/16 with AFib/Flutter. He converted to NSR with IV Diltiazem. He was anticoagulated with Xarelto. Echo demonstrated normal LVF. CHADS2-VASc=1. He followed up in the [facility].  He was previously was seen by Dr. Marlou Porch.    Since I last saw him  he has had no palpitations, presyncope or syncope.  He actually feels pretty good.  Is been relatively active.  The patient does not have symptoms concerning for COVID-19 infection (fever, chills, cough, or new shortness of breath).    Past Medical History:  Diagnosis Date  . Atrial fibrillation ([place])   .  OSA (obstructive sleep apnea)   . 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 WPW (Wolff-Parkinson-White syndrome)    Epicardial posteroseptal accessory pathway - unsuccessful RFA 2007    Past Surgical History:  Procedure Laterality Date  . Oropharyngeal release surgery    . TONSILLECTOMY       Current Meds  Medication Sig  . acetaminophen (TYLENOL) 325 MG tablet Take 650 mg by mouth every 6 (six) hours as needed for mild pain.  [name] CARTIA XT 120 MG 24 hr capsule Take 1 capsule by mouth 4 times daily  . digoxin (LANOXIN) 0.25 MG tablet Take 1 tablet by mouth once daily  . Multiple Vitamin (MULTIVITAMIN) tablet Take 1 tablet by mouth daily.  . Multiple Vitamins-Minerals (PRESERVISION/LUTEIN PO) Take 1 capsule by mouth daily.     Allergies:   Ciprofloxacin, Levaquin [levofloxacin], and Metoprolol   Social History   Tobacco Use  . Smoking status: Former Smoker    Types: Cigarettes  . Smokeless tobacco: Never Used  Substance Use Topics  . Alcohol use: No    Alcohol/week: 0.0 standard drinks  . Drug use: No     Family Hx: The patient's family history includes Atrial fibrillation in his father and mother.  ROS:   Please see the history of present illness.    Passed a kidney stone All other systems reviewed and are negative.   Prior CV studies:   The following studies were reviewed today: NA  Labs/Other Tests and Data Reviewed:  EKG:  No ECG reviewed.  Recent Labs: No results found for requested labs within last 8760 hours.   Recent Lipid Panel Lab Results  Component Value Date/Time   CHOL 157 09/24/2014 02:30 AM   TRIG 111 09/24/2014 02:30 AM   HDL 27 (L) 09/24/2014 02:30 AM   CHOLHDL 5.8 09/24/2014 02:30 AM   LDLCALC 108 (H) 09/24/2014 02:30 AM    Wt Readings from Last 3 Encounters:  08/25/17 (!) 375 lb 12.8 oz (170.5 kg)  03/16/17 (!) 391 lb 9.6 oz (177.6 kg)  12/07/16 (!) 380 lb 3.2 oz (172.5 kg)     Objective:     Vital Signs:  There were no vitals taken for this visit.   NA  ASSESSMENT & [name]:    PAF:    Mr. Sean [name] has a CHA2DS2 - VASc score of 1.  No symptoms.  No change in therapy.  HTN:  He says he does not have this and did not want to take his BP.  He says he runs 120/70  SLEEP APNEA;  He had surgery for this.    OBESITY:  He did not weigh today.   WPW:    This was ablated.   I will renew the meds as listed for another year.    Time:   Today, I have spent 15 minutes with the patient with telehealth technology discussing the above problems.     Medication Adjustments/Labs and Tests Ordered: Current medicines are reviewed at length with the patient today.  Concerns regarding medicines are outlined above.   Tests Ordered: No orders of the defined types were placed in this encounter.   Medication Changes: No orders of the defined types were placed in this encounter.   Follow Up:  In Person in one year  Signed, Rollene RotundaJames Emilina Smarr, MD  11/02/2018 9:12 AM    [facility]

## 2018-11-02 ENCOUNTER — Telehealth (INDEPENDENT_AMBULATORY_CARE_PROVIDER_SITE_OTHER): Payer: Self-pay | Admitting: Cardiology

## 2018-11-02 DIAGNOSIS — I480 Paroxysmal atrial fibrillation: Secondary | ICD-10-CM

## 2018-11-02 DIAGNOSIS — I456 Pre-excitation syndrome: Secondary | ICD-10-CM

## 2018-11-02 MED ORDER — DILTIAZEM HCL ER COATED BEADS 120 MG PO CP24: ORAL_CAPSULE | ORAL | 3 refills | Status: DC

## 2018-11-02 MED ORDER — DIGOXIN 250 MCG PO TABS: 250.0000 ug | ORAL_TABLET | Freq: Every day | ORAL | 3 refills | Status: DC

## 2018-11-02 NOTE — Patient Instructions (Signed)

## 2019-02-09 ENCOUNTER — Encounter

## 2019-10-12 ENCOUNTER — Telehealth: Payer: Self-pay | Admitting: Cardiology

## 2019-10-12 NOTE — Telephone Encounter (Signed)
Unable to leave voicemail.

## 2019-11-10 IMAGING — CT CT RENAL STONE PROTOCOL
2 of 4 series · 17 of 46 positions shown, 19 images · non-contrast
Comparison: None.

CLINICAL DATA: Evaluation of acute left flank pain. Personal
history of kidney stones.

EXAM:
CT ABDOMEN AND PELVIS WITHOUT CONTRAST
TECHNIQUE: Multidetector CT imaging of the abdomen and pelvis was performed
following the standard protocol without IV contrast.

[Series 3: stone study 5.0 i30f 2 · axial · 0.98mm/px · z∈[+764,+1224]mm · 14 of 102 slices shown, 16 images]
[im 5/102  soft-tissue]
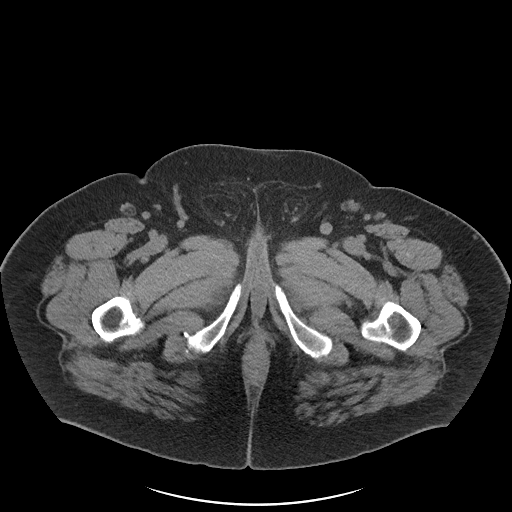
[im 5/102  bone]
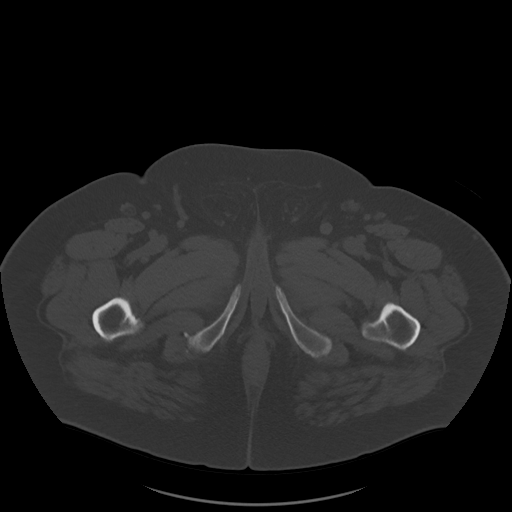
[im 13/102  soft-tissue]
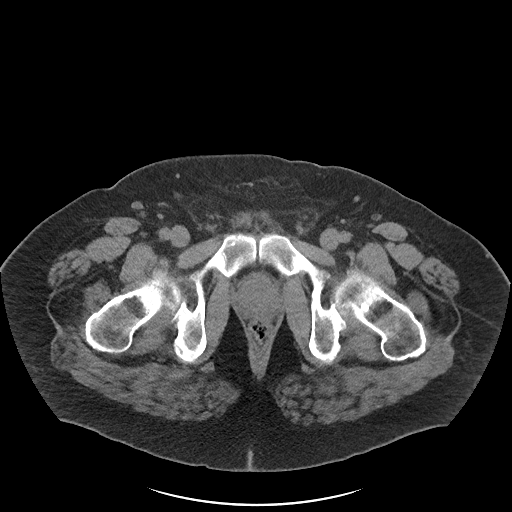
[im 22/102  soft-tissue]
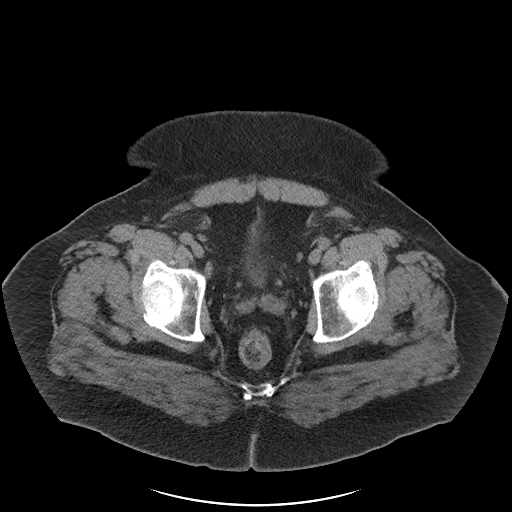
[im 26/102  soft-tissue]
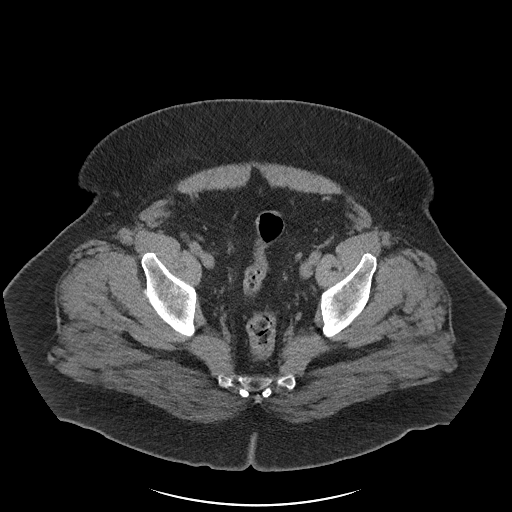
[im 34/102  soft-tissue]
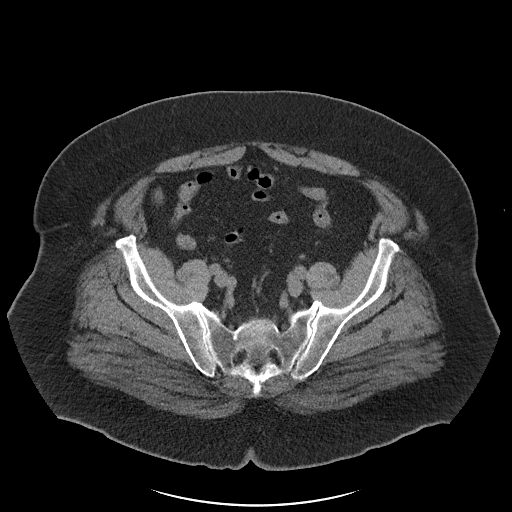
[im 43/102  soft-tissue]
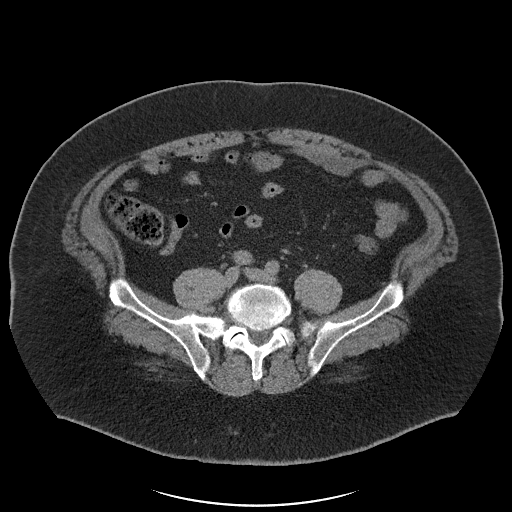
[im 47/102  soft-tissue]
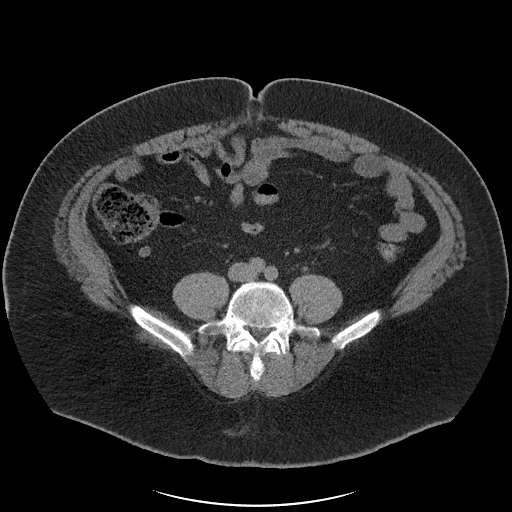
[im 55/102  soft-tissue]
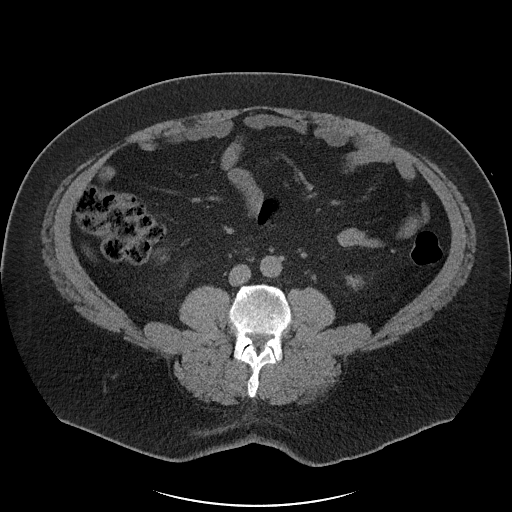
[im 59/102  soft-tissue]
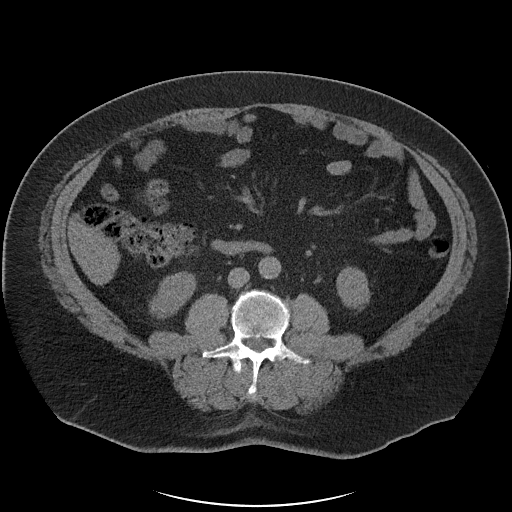
[im 59/102  bone]
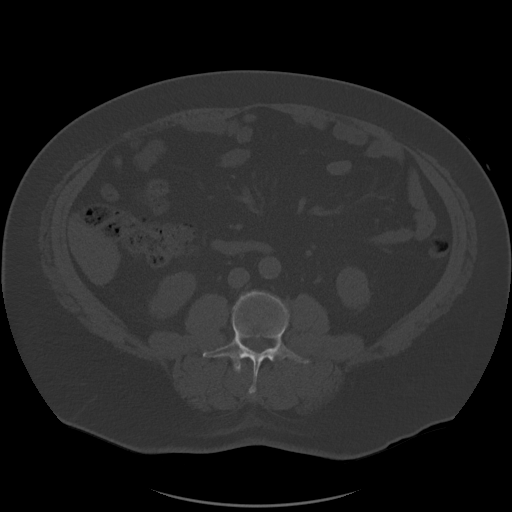
[im 68/102  soft-tissue]
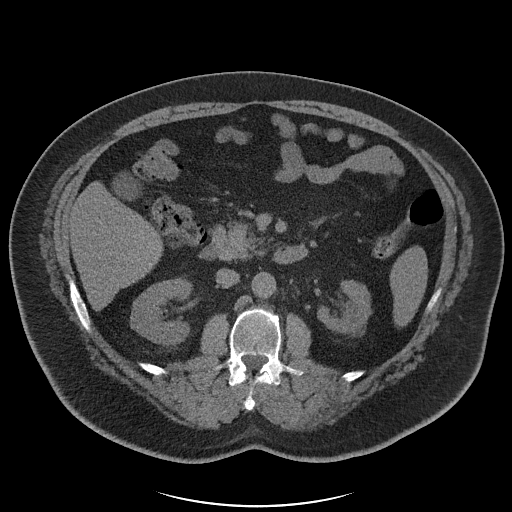
[im 76/102  soft-tissue]
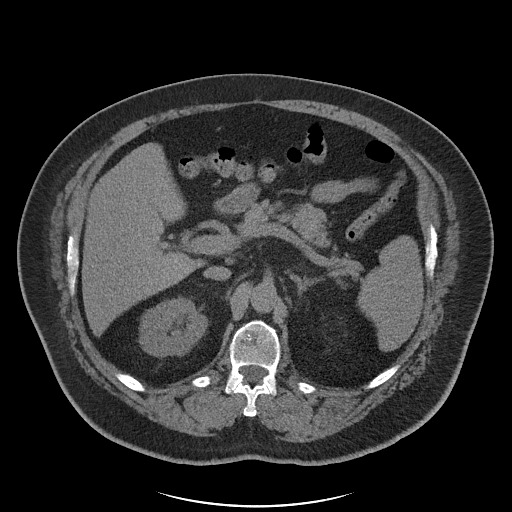
[im 80/102  soft-tissue]
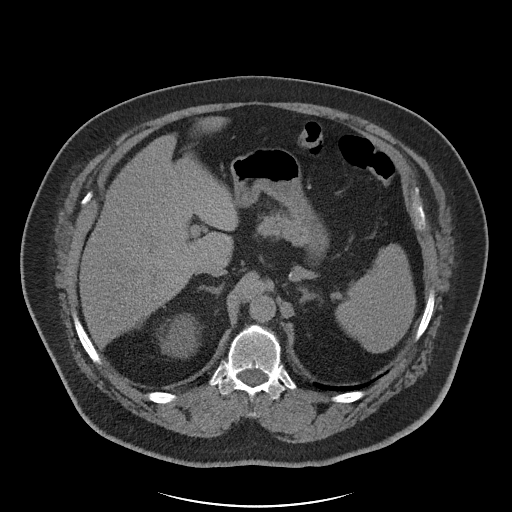
[im 89/102  soft-tissue]
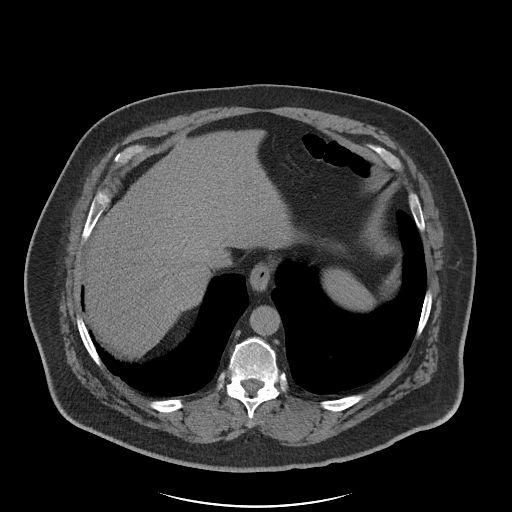
[im 97/102  soft-tissue]
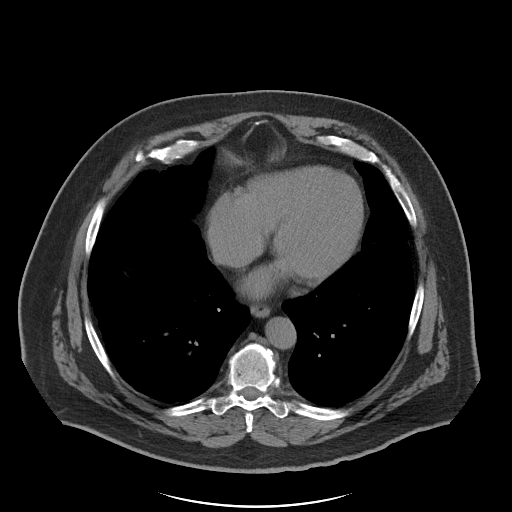

[Series 6: coronal soft tissue · coronal · 1.00mm/px · 3 of 146 slices shown]
[im 49/146  soft-tissue]
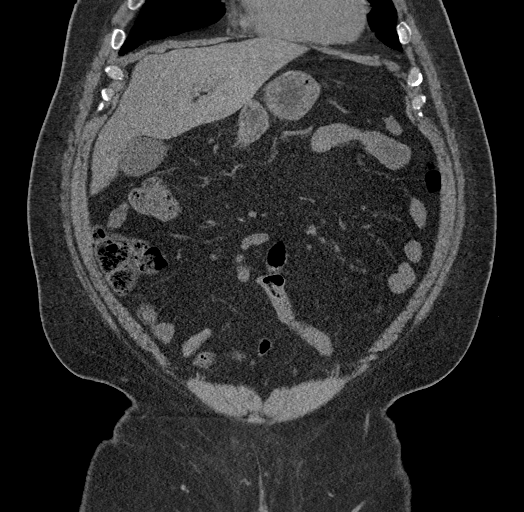
[im 65/146  soft-tissue]
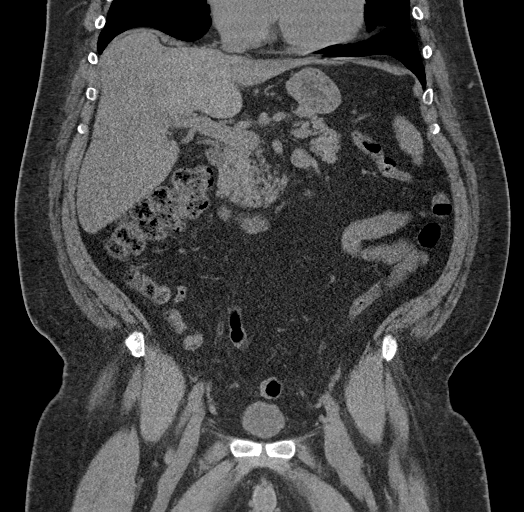
[im 81/146  soft-tissue]
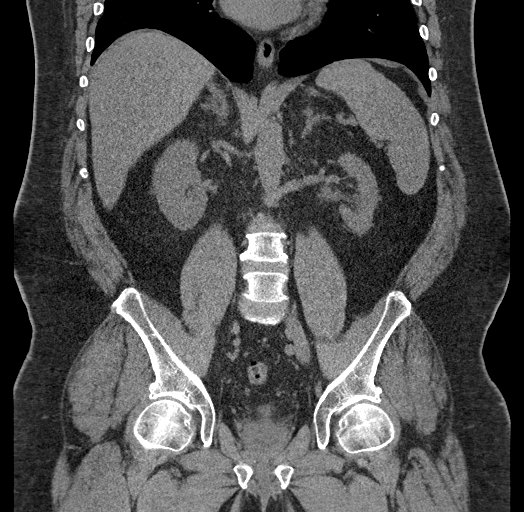

[17 of 46 positions shown; findings below may reference images not displayed]

FINDINGS: Lower chest: Normal

Hepatobiliary: Normal

Pancreas: Normal

Spleen: Normal

Adrenals/Urinary Tract: Adrenal glands are normal. The right kidney
is normal except for a cyst at the upper pole measuring 4.5 cm in
diameter. The left kidney shows moderate atrophy. No sign of cyst,
mass, stone or hydronephrosis. However, in the left ureter at the
pelvic brim level, there is a 6 mm stone which despite its size does
not appear to be causing hydroureteronephrosis. No stone in the
bladder.

Stomach/Bowel: Normal

Vascular/Lymphatic: Minimal aortic atherosclerosis. IVC is normal.
No retroperitoneal adenopathy.

Reproductive: Normal

Other: No free fluid or air.

Musculoskeletal: Mild lower lumbar degenerative changes.
IMPRESSION: The left kidney shows moderate atrophy. There is a 6 mm stone in the
left ureter at the pelvic brim level. Despite the size of this
stone, there does not appear to be any hydroureteronephrosis.

## 2019-11-22 DIAGNOSIS — I10 Essential (primary) hypertension: Secondary | ICD-10-CM | POA: Insufficient documentation

## 2019-11-22 DIAGNOSIS — I456 Pre-excitation syndrome: Secondary | ICD-10-CM | POA: Insufficient documentation

## 2019-11-22 DIAGNOSIS — Z7189 Other specified counseling: Secondary | ICD-10-CM | POA: Insufficient documentation

## 2019-11-22 NOTE — Progress Notes (Signed)
Cardiology Office Note   Date:  11/22/2019   ID:  Sean [name], DOB 02-27-1961, MRN [id number]  PCP:  Patient, No Pcp Per  Cardiologist:   Rollene Rotunda, MD   No chief complaint on file.     History of Present Illness: Sean [name] is a 59 y.o. male who presents for Who presents for follow up of PSVT and WPW status post ablation x 2 (Dr. Ladona Ridgel and then Dr. Sampson Goon at [facility]) .  He did have recurrent fibrillation after this.    Since I last saw him he has had no new cardiovascular problems.  He is chronically in atrial fibrillation. The patient denies any new symptoms such as chest discomfort, neck or arm discomfort. There has been no new shortness of breath, PND or orthopnea. There have been no reported palpitations, presyncope or syncope.   Past Medical History:  Diagnosis Date  . Atrial fibrillation ([facility])   . OSA (obstructive sleep apnea)   . 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 WPW (Wolff-Parkinson-White syndrome)    Epicardial posteroseptal accessory pathway - unsuccessful RFA 2007     Past Surgical History:  Procedure Laterality Date  . Oropharyngeal release surgery    . TONSILLECTOMY       Current Outpatient Medications  Medication Sig Dispense Refill  . acetaminophen (TYLENOL) 325 MG tablet Take 650 mg by mouth every 6 (six) hours as needed for mild pain.    [name] digoxin (LANOXIN) 0.25 MG tablet Take 1 tablet (250 mcg total) by mouth daily. 90 tablet 3  . diltiazem (CARTIA XT) 120 MG 24 hr capsule Take 1 capsule by mouth 4 times daily 360 capsule 3  . Multiple Vitamin (MULTIVITAMIN) tablet Take 1 tablet by mouth daily.    . Multiple Vitamins-Minerals (PRESERVISION/LUTEIN PO) Take 1 capsule by mouth daily.     No current facility-administered medications for this visit.    Allergies:   Ciprofloxacin, Levaquin [levofloxacin], and Metoprolol    ROS:  Please see the history of present illness.   Otherwise,  review of systems are positive for knee pain.   All other systems are reviewed and negative.    PHYSICAL EXAM: VS:  There were no vitals taken for this visit. , BMI There is no height or weight on file to calculate BMI. GENERAL:  Well appearing NECK:  No jugular venous distention, waveform within normal limits, carotid upstroke brisk and symmetric, no bruits, no thyromegaly LUNGS:  Clear to auscultation bilaterally CHEST:  Unremarkable HEART:  PMI not displaced or sustained,S1 and S2 within normal limits, no S3, no clicks, no rubs, no murmurs, irregular ABD:  Flat, positive bowel sounds normal in frequency in pitch, no bruits, no rebound, no guarding, no midline pulsatile mass, no hepatomegaly, no splenomegaly EXT:  2 plus pulses throughout, no edema, no cyanosis no clubbing   EKG:  EKG is ordered today. The ekg ordered today demonstrates atrial fibrillation, rate 85, axis within normal limits, poor anterior R wave progression, low voltage in the limb leads, nonspecific T wave changes possibly digoxin effect.   Recent Labs: No results found for requested labs within last 8760 hours.    Lipid Panel    Component Value Date/Time   CHOL 157 09/24/2014 0230   TRIG 111 09/24/2014 0230   HDL 27 (L) 09/24/2014 0230   CHOLHDL 5.8 09/24/2014 0230   VLDL 22 09/24/2014 0230   LDLCALC 108 (H) 09/24/2014 0230  Wt Readings from Last 3 Encounters:  08/25/17 (!) 375 lb 12.8 oz (170.5 kg)  03/16/17 (!) 391 lb 9.6 oz (177.6 kg)  12/07/16 (!) 380 lb 3.2 oz (172.5 kg)      Other studies Reviewed: Additional studies/ records that were reviewed today include: None. Review of the above records demonstrates:  Please see elsewhere in the note.     ASSESSMENT AND PLAN:  PAF:    Mr. [name] has a CHA2DS2 - VASc score of 1.    No change in therapy.  I will be checking some routine blood work including a CBC and basic metabolic profile as he has not had these in a long time and does  not have primary provider.   HTN:   Blood pressure is upper limits of normal but it is usually well controlled otherwise.  No change in therapy.  SLEEP APNEA:   He had surgical correction of this.    OBESITY:   His weights are unchanged.  We talked about weight loss with diet next previously.   WPW:     He has had an ablation.  No change in therapy  COVID EDUCATION: We had a long discussion about this and the role of antibody testing and he might think about getting this and let this guide whether he gets the vaccine or not.   Current medicines are reviewed at length with the patient today.  The patient does not have concerns regarding medicines.  The following changes have been made:  no change  Labs/ tests ordered today include:  No orders of the defined types were placed in this encounter.    Disposition:   FU with me in one year.     Signed, Rollene Rotunda, MD  11/22/2019 4:48 PM    [facility]

## 2019-11-23 ENCOUNTER — Encounter: Payer: Self-pay | Admitting: Cardiology

## 2019-11-23 ENCOUNTER — Other Ambulatory Visit: Payer: Self-pay

## 2019-11-23 ENCOUNTER — Ambulatory Visit (INDEPENDENT_AMBULATORY_CARE_PROVIDER_SITE_OTHER): Payer: Self-pay | Admitting: Cardiology

## 2019-11-23 VITALS — BP 144/90 | HR 85 | Ht 72.0 in | Wt 396.6 lb

## 2019-11-23 DIAGNOSIS — I10 Essential (primary) hypertension: Secondary | ICD-10-CM

## 2019-11-23 DIAGNOSIS — Z7189 Other specified counseling: Secondary | ICD-10-CM

## 2019-11-23 DIAGNOSIS — I456 Pre-excitation syndrome: Secondary | ICD-10-CM

## 2019-11-23 DIAGNOSIS — I480 Paroxysmal atrial fibrillation: Secondary | ICD-10-CM

## 2019-11-23 LAB — CBC
Hematocrit: 37.5 % (ref 37.5–51.0)
Hemoglobin: 12 g/dL — ABNORMAL LOW (ref 13.0–17.7)
MCH: 27.6 pg (ref 26.6–33.0)
MCHC: 32 g/dL (ref 31.5–35.7)
MCV: 86 fL (ref 79–97)
Platelets: 236 10*3/uL (ref 150–450)
RBC: 4.35 x10E6/uL (ref 4.14–5.80)
RDW: 15.1 % (ref 11.6–15.4)
WBC: 11.9 10*3/uL — ABNORMAL HIGH (ref 3.4–10.8)

## 2019-11-23 LAB — BASIC METABOLIC PANEL
BUN/Creatinine Ratio: 15 (ref 9–20)
BUN: 25 mg/dL — ABNORMAL HIGH (ref 6–24)
CO2: 25 mmol/L (ref 20–29)
Calcium: 9.1 mg/dL (ref 8.7–10.2)
Chloride: 105 mmol/L (ref 96–106)
Creatinine, Ser: 1.69 mg/dL — ABNORMAL HIGH (ref 0.76–1.27)
GFR calc Af Amer: 50 mL/min/{1.73_m2} — ABNORMAL LOW (ref >59)
GFR calc non Af Amer: 43 mL/min/{1.73_m2} — ABNORMAL LOW (ref >59)
Glucose: 93 mg/dL (ref 65–99)
Potassium: 4.5 mmol/L (ref 3.5–5.2)
Sodium: 143 mmol/L (ref 134–144)

## 2019-11-23 NOTE — Patient Instructions (Signed)
Medication Instructions:  Your physician recommends that you continue on your current medications as directed. Please refer to the Current Medication list given to you today.  *If you need a refill on your cardiac medications before your next appointment, please call your pharmacy*   Lab Work: Your physician recommends that you return for lab work today: BMET, CBC  If you have labs (blood work) drawn today and your tests are completely normal, you will receive your results only by:  MyChart Message (if you have MyChart) OR  A paper copy in the mail If you have any lab test that is abnormal or we need to change your treatment, we will call you to review the results.   Follow-Up: At [facility], you and your health needs are our priority.  As part of our continuing mission to provide you with exceptional heart care, we have created designated Provider Care Teams.  These Care Teams include your primary Cardiologist (physician) and Advanced Practice Providers (APPs -  Physician Assistants and Nurse Practitioners) who all work together to provide you with the care you need, when you need it.  We recommend signing up for the patient portal called "MyChart".  Sign up information is provided on this After Visit Summary.  MyChart is used to connect with patients for Virtual Visits (Telemedicine).  Patients are able to view lab/test results, encounter notes, upcoming appointments, etc.  Non-urgent messages can be sent to your provider as well.   To learn more about what you can do with MyChart, go to [email or web address].    Your next appointment:   12 month(s)  The format for your next appointment:   In Person  Provider:   You may see Rollene Rotunda, MD or one of the following Advanced Practice Providers on your designated Care Team:    Theodore Demark, PA-C  Joni Reining, DNP, ANP  Cadence Fransico Michael, PA-C    Other Instructions Please call our office 2 months in advance to  schedule your follow-up appointment with Dr. Antoine Poche.

## 2019-11-26 ENCOUNTER — Telehealth: Payer: Self-pay | Admitting: *Deleted

## 2019-11-26 DIAGNOSIS — N289 Disorder of kidney and ureter, unspecified: Secondary | ICD-10-CM

## 2019-11-26 NOTE — Telephone Encounter (Signed)
Advised patient of lab results and orders placed for BMET in 1 month

## 2019-11-26 NOTE — Telephone Encounter (Signed)
-----   Message from Rollene Rotunda, MD sent at 11/25/2019 11:48 AM EDT ----- Creat is elevated.  I would like to repeat a BMET in one month.  Call Mr. Bontrager with the results

## 2020-01-15 ENCOUNTER — Other Ambulatory Visit: Payer: Self-pay | Admitting: Cardiology

## 2020-01-16 NOTE — Telephone Encounter (Signed)
Pt called in stated that the diltiazem (TIAZAC) 120 MG 24 hr capsule [[id number] was called in for only 60 days .  He stated he has been getting a 90 day supply for years.  He would like to have this med recalled in for a 90 day supply .    Best number [phone] for pt     [facility] [address] [place]), [place] - [address]  [address] [place] ([place]) [place] [address]  Phone:  [phone] Fax:  [phone]

## 2020-03-10 ENCOUNTER — Other Ambulatory Visit: Payer: Self-pay | Admitting: Cardiology

## 2020-05-13 ENCOUNTER — Other Ambulatory Visit: Payer: Self-pay | Admitting: Cardiology

## 2020-07-17 ENCOUNTER — Other Ambulatory Visit: Payer: Self-pay | Admitting: Cardiology

## 2020-07-23 ENCOUNTER — Other Ambulatory Visit: Payer: Self-pay | Admitting: Cardiology

## 2020-09-19 ENCOUNTER — Other Ambulatory Visit: Payer: Self-pay | Admitting: Cardiology

## 2020-11-27 ENCOUNTER — Other Ambulatory Visit: Payer: Self-pay | Admitting: Cardiology

## 2021-03-13 ENCOUNTER — Other Ambulatory Visit: Payer: Self-pay | Admitting: Cardiology

## 2021-03-19 ENCOUNTER — Other Ambulatory Visit: Payer: Self-pay

## 2021-03-20 ENCOUNTER — Telehealth: Payer: Self-pay | Admitting: *Deleted

## 2021-03-20 ENCOUNTER — Encounter: Payer: Self-pay | Admitting: *Deleted

## 2021-03-20 NOTE — Telephone Encounter (Signed)
This encounter was created in error - please disregard.

## 2021-03-20 NOTE — Telephone Encounter (Signed)
Received refill request from [facility] pharmacy for diltiazem ER 120 mg cap take 1 cap 4 times daily. Tried to call patient and his voicemail is full. Also the patient has not been seen since 11/2019 and needs a follow up appointment.

## 2021-04-09 NOTE — Telephone Encounter (Signed)
Spoke with pt, Follow up scheduled  

## 2021-04-22 NOTE — Progress Notes (Signed)
Cardiology Office Note   Date:  04/23/2021   ID:  Sean [name], DOB Apr 30, 1960, MRN [id number]  PCP:  Patient, No Pcp Per (Inactive)  Cardiologist:   Rollene Rotunda, MD   Chief Complaint  Patient presents with   Atrial Fibrillation      History of Present Illness: Sean [name] is a 61 y.o. male who presents for Who presents for follow up of PSVT and WPW status post ablation x 2 (Dr. Ladona Ridgel and then Dr. Sampson Goon at [facility]) .  He did have recurrent fibrillation after this.    Since I last saw him he has done well.  He has his atrial fibrillation but he does not really notice this unless he is doing something particularly exerting like climbing up to 15 stairs from his basement.  He checks his pulse ox frequently and he is heart rate will go to the 120s when he is active but comes back down to the 60s to 80s at rest.  He does not have any presyncope or syncope.  He does not have any new shortness of breath, PND or orthopnea.  He has no chest pressure, neck or arm discomfort.   Past Medical History:  Diagnosis Date   Atrial fibrillation (HCC)    OSA (obstructive sleep apnea)    PSVT (paroxysmal supraventricular tachycardia) (HCC)    Orthodromic AVRT   WPW (Wolff-Parkinson-White syndrome)    Epicardial posteroseptal accessory pathway - unsuccessful RFA 2007     Past Surgical History:  Procedure Laterality Date   Oropharyngeal release surgery     TONSILLECTOMY       Current Outpatient Medications  Medication Sig Dispense Refill   acetaminophen (TYLENOL) 325 MG tablet Take 650 mg by mouth every 6 (six) hours as needed for mild pain.     digoxin (LANOXIN) 0.25 MG tablet Take 1 tablet (0.25 mg total) by mouth daily. 90 tablet 3   diltiazem (TIAZAC) 120 MG 24 hr capsule Take 1 capsule (120 mg total) by mouth daily. 90 capsule 3   Multiple Vitamin (MULTIVITAMIN) tablet Take 1 tablet by mouth daily.     Multiple Vitamins-Minerals (EYE HEALTH) CAPS Take by mouth.      No current facility-administered medications for this visit.    Allergies:   Ciprofloxacin, Levaquin [levofloxacin], and Metoprolol    ROS:  Please see the history of present illness.   Otherwise, review of systems are positive for none.   All other systems are reviewed and negative.    PHYSICAL EXAM: VS:  BP 138/90    Pulse 93    Ht 6' (1.829 m)    Wt (!) 380 lb (172.4 kg)    SpO2 97%    BMI 51.54 kg/m  , BMI Body mass index is 51.54 kg/m. GENERAL:  Well appearing NECK:  No jugular venous distention, waveform within normal limits, carotid upstroke brisk and symmetric, no bruits, no thyromegaly LUNGS:  Clear to auscultation bilaterally CHEST:  Unremarkable HEART:  PMI not displaced or sustained,S1 and S2 within normal limits, no S3, no clicks, no rubs, no murmurs, irregular ABD:  Flat, positive bowel sounds normal in frequency in pitch, no bruits, no rebound, no guarding, no midline pulsatile mass, no hepatomegaly, no splenomegaly EXT:  2 plus pulses throughout, no edema, no cyanosis no clubbing   EKG:  EKG is  ordered today. The ekg ordered today demonstrates atrial fibrillation, rate 93, axis within normal limits, poor anterior R wave progression, low voltage in the  limb leads, nonspecific T wave changes possibly digoxin effect.   Recent Labs: No results found for requested labs within last 8760 hours.    Lipid Panel    Component Value Date/Time   CHOL 157 09/24/2014 0230   TRIG 111 09/24/2014 0230   HDL 27 (L) 09/24/2014 0230   CHOLHDL 5.8 09/24/2014 0230   VLDL 22 09/24/2014 0230   LDLCALC 108 (H) 09/24/2014 0230      Wt Readings from Last 3 Encounters:  04/23/21 (!) 380 lb (172.4 kg)  11/23/19 (!) 396 lb 9.6 oz (179.9 kg)  08/25/17 (!) 375 lb 12.8 oz (170.5 kg)      Other studies Reviewed: Additional studies/ records that were reviewed today include: None. Review of the above records demonstrates:  Please see elsewhere in the note.     ASSESSMENT AND  PLAN:  CHRONIC ATRIAL FIB:    Sean [name] has a CHA2DS2 - VASc score of 1.  No change in therapy.  He has good rate control and low embolic risk.  We talked about taking an aspirin.  I will check a basic metabolic profile today as has not had potassium checked in a while.  He will remain on the digoxin and Cardizem.  HTN:  The blood pressure is well controlled.  Continue meds as listed.  SLEEP APNEA: He had surgical repair of this.  OBESITY: He injured himself this year slipping in the mud and so has not been as active but hopes to bring his weight back down.    WPW:     He has had an ablation.    Current medicines are reviewed at length with the patient today.  The patient does not have concerns regarding medicines.  The following changes have been made:  None  Labs/ tests ordered today include: ' Orders Placed This Encounter  Procedures   Basic metabolic panel   EKG 12-Lead     Disposition:   FU with me in one year.     Signed, Rollene Rotunda, MD  04/23/2021 4:06 PM    [facility]

## 2021-04-23 ENCOUNTER — Ambulatory Visit: Payer: Self-pay | Admitting: Cardiology

## 2021-04-23 ENCOUNTER — Ambulatory Visit (INDEPENDENT_AMBULATORY_CARE_PROVIDER_SITE_OTHER): Payer: Self-pay | Admitting: Cardiology

## 2021-04-23 ENCOUNTER — Other Ambulatory Visit: Payer: Self-pay

## 2021-04-23 ENCOUNTER — Encounter: Payer: Self-pay | Admitting: Cardiology

## 2021-04-23 VITALS — BP 138/90 | HR 93 | Ht 72.0 in | Wt 380.0 lb

## 2021-04-23 DIAGNOSIS — I480 Paroxysmal atrial fibrillation: Secondary | ICD-10-CM

## 2021-04-23 MED ORDER — DILTIAZEM HCL ER BEADS 120 MG PO CP24: 120.0000 mg | ORAL_CAPSULE | Freq: Every day | ORAL | 3 refills | Status: DC

## 2021-04-23 MED ORDER — DIGOXIN 250 MCG PO TABS: 0.2500 mg | ORAL_TABLET | Freq: Every day | ORAL | 3 refills | Status: DC

## 2021-04-23 NOTE — Patient Instructions (Signed)
Medication Instructions:  Your Physician recommend you continue on your current medication as directed.    *If you need a refill on your cardiac medications before your next appointment, please call your pharmacy*  Lab Work: Today BMET If you have labs (blood work) drawn today and your tests are completely normal, you will receive your results only by: MyChart Message (if you have MyChart) OR A paper copy in the mail If you have any lab test that is abnormal or we need to change your treatment, we will call you to review the results.   Follow-Up: At [facility], you and your health needs are our priority.  As part of our continuing mission to provide you with exceptional heart care, we have created designated Provider Care Teams.  These Care Teams include your primary Cardiologist (physician) and Advanced Practice Providers (APPs -  Physician Assistants and Nurse Practitioners) who all work together to provide you with the care you need, when you need it.  We recommend signing up for the patient portal called "MyChart".  Sign up information is provided on this After Visit Summary.  MyChart is used to connect with patients for Virtual Visits (Telemedicine).  Patients are able to view lab/test results, encounter notes, upcoming appointments, etc.  Non-urgent messages can be sent to your provider as well.   To learn more about what you can do with MyChart, go to [email or web address].    Your next appointment:   18 month(s)  The format for your next appointment:   In Person  Provider:   Rollene Rotunda, MD

## 2021-04-24 LAB — BASIC METABOLIC PANEL
BUN/Creatinine Ratio: 15 (ref 10–24)
BUN: 23 mg/dL (ref 8–27)
CO2: 25 mmol/L (ref 20–29)
Calcium: 9.6 mg/dL (ref 8.6–10.2)
Chloride: 103 mmol/L (ref 96–106)
Creatinine, Ser: 1.57 mg/dL — ABNORMAL HIGH (ref 0.76–1.27)
Glucose: 82 mg/dL (ref 70–99)
Potassium: 4.8 mmol/L (ref 3.5–5.2)
Sodium: 141 mmol/L (ref 134–144)
eGFR: 50 mL/min/{1.73_m2} — ABNORMAL LOW (ref >59)

## 2021-05-18 ENCOUNTER — Other Ambulatory Visit: Payer: Self-pay

## 2021-05-18 DIAGNOSIS — I480 Paroxysmal atrial fibrillation: Secondary | ICD-10-CM

## 2021-05-19 ENCOUNTER — Telehealth: Payer: Self-pay | Admitting: *Deleted

## 2021-05-19 DIAGNOSIS — I480 Paroxysmal atrial fibrillation: Secondary | ICD-10-CM

## 2021-05-19 NOTE — Telephone Encounter (Signed)
The patient came in as a walk in today frustrated that his Diltiazem was sent in wrong. He stated that he takes Diltiazem 120 mg 4 times daily because it is cheaper than taking it as one tablet. The last refill was sent in as 120 mg once daily. The patient stated that he has been on 120 mg 4 times daily for years now.   His last office visit this month shows once daily but previous dosages are 120  mg 4 times daily.

## 2021-05-20 MED ORDER — DILTIAZEM HCL ER BEADS 120 MG PO CP24: 120.0000 mg | ORAL_CAPSULE | Freq: Four times a day (QID) | ORAL | 3 refills | Status: DC

## 2021-05-20 NOTE — Telephone Encounter (Signed)
Yes, okay to refill immediate release diltiazem 120 mg 4 times daily

## 2021-05-20 NOTE — Telephone Encounter (Addendum)
Previous prescription that the patient has been on for years has been sent in:   diltiazem (CARTIA XT) 120 MG 24 hr capsule Take 1 capsule by mouth 4 times daily 360 capsule 3

## 2022-07-07 ENCOUNTER — Other Ambulatory Visit: Payer: Self-pay | Admitting: Cardiology

## 2022-07-07 DIAGNOSIS — I480 Paroxysmal atrial fibrillation: Secondary | ICD-10-CM

## 2022-07-14 ENCOUNTER — Other Ambulatory Visit: Payer: Self-pay | Admitting: *Deleted

## 2022-07-14 ENCOUNTER — Telehealth: Payer: Self-pay | Admitting: Cardiology

## 2022-07-14 DIAGNOSIS — I480 Paroxysmal atrial fibrillation: Secondary | ICD-10-CM

## 2022-07-14 MED ORDER — DIGOXIN 250 MCG PO TABS: 250.0000 ug | ORAL_TABLET | Freq: Every day | ORAL | 0 refills | Status: DC

## 2022-07-14 MED ORDER — DILTIAZEM HCL ER BEADS 120 MG PO CP24: ORAL_CAPSULE | ORAL | 2 refills | Status: DC

## 2022-07-14 NOTE — Telephone Encounter (Signed)
*  STAT* If patient is at the pharmacy, call can be transferred to refill team.   1. Which medications need to be refilled? (please list name of each medication and dose if known) digoxin (LANOXIN) 0.25 MG tablet   diltiazem (TIAZAC) 120 MG 24 hr capsule    2. Which pharmacy/location (including street and city if local pharmacy) is medication to be sent to? [place] ([place]), [place] - [address]   3. Do they need a 30 day or 90 day supply? 90 day

## 2022-07-15 MED ORDER — DIGOXIN 250 MCG PO TABS: 250.0000 ug | ORAL_TABLET | Freq: Every day | ORAL | 0 refills | Status: AC

## 2022-07-15 MED ORDER — DILTIAZEM HCL ER BEADS 120 MG PO CP24: 120.0000 mg | ORAL_CAPSULE | Freq: Four times a day (QID) | ORAL | 0 refills | Status: DC

## 2022-07-15 NOTE — Telephone Encounter (Signed)
Patient called and upset that his prescription was not at pharmacy.  Called and verified RX was sent yesterday and received.  They also state he had refills that have been ready for 2 and 6 days.  I let patient know the information and he gets louder and states "you don't understand, yall didn't send it right".  He goes on to talk bad about pharmacy, so interrupted and advised I would look at medications.  Conclusion he had a 30 day supply of diltiazem and digoxin in order to get an appt.  He did not get an appt. before the medication  ran out.  He now has an appt for July 3rd. He wanted his RX for 90 days.  I did send the new RX for 90 days with the understanding we cannot refill if he does not come to his scheduled appt. He states understanding.  Refill sent and pharmacy notified that this is a new RX for 90 days and notation of no refills until appt.

## 2022-10-18 NOTE — Progress Notes (Unsigned)
  Cardiology Office Note:   Date:  10/20/2022  ID:  Sean [name], DOB July 30, 1960, MRN [id number] PCP: Patient, No Pcp Per  [facility] Providers Cardiologist:  Rollene Rotunda, MD {  History of Present Illness:   Sean [name] is a 62 y.o. male  who presents for Who presents for follow up of PSVT and WPW status post ablation x 2 (Dr. Ladona Ridgel and then Dr. Sampson Goon at [facility]) .  He did have recurrent fibrillation after this.     Since I last saw him he has had no complaints.  The patient denies any new symptoms such as chest discomfort, neck or arm discomfort. There has been no new shortness of breath, PND or orthopnea. There have been no reported palpitations, presyncope or syncope.   He reports that his rate is in the 90s on his pulse ox typically.  He is getting ready to sell two homes and move to 76 acres of land he owns.     ROS: As stated in the HPI and negative for all other systems.  Studies Reviewed:    EKG:   EKG Interpretation Date/Time:  Wednesday October 20 2022 08:37:14 EDT Ventricular Rate:  115 PR Interval:    QRS Duration:  102 QT Interval:  322 QTC Calculation: 445 R Axis:   -35  Text Interpretation: Atrial fibrillation with rapid ventricular response Left axis deviation Nonspecific ST and T wave abnormality When compared with ECG of 04/23/21  No change Confirmed by Rollene Rotunda ([id number]) on 10/20/2022 8:48:47 AM     Risk Assessment/Calculations:    CHA2DS2-VASc Score = 1   This indicates a 0.6% annual risk of stroke. The patient's score is based upon: CHF History: 0 HTN History: 1 Diabetes History: 0 Stroke History: 0 Vascular Disease History: 0 Age Score: 0 Gender Score: 0              Physical Exam:   VS:  BP 120/80   Pulse (!) 115   Ht 6' (1.829 m)   Wt (!) 397 lb 6.4 oz (180.3 kg)   SpO2 95%   BMI 53.90 kg/m    Wt Readings from Last 3 Encounters:  10/20/22 (!) 397 lb 6.4 oz (180.3 kg)  04/23/21 (!) 380 lb (172.4 kg)  11/23/19 (!)  396 lb 9.6 oz (179.9 kg)     GEN: Well nourished, well developed in no acute distress NECK: No JVD; No carotid bruits CARDIAC: Irregular RR, no murmurs, rubs, gallops RESPIRATORY:  Clear to auscultation without rales, wheezing or rhonchi  ABDOMEN: Soft, non-tender, non-distended EXTREMITIES:  No edema; No deformity   ASSESSMENT AND PLAN:   CHRONIC ATRIAL FIB:    Mr. Sean [name] has a CHA2DS2 - VASc score of 1.  He has no symptoms.   No change in therapy.  He continues 120 mg CD Cardizem 4 x daily.    HTN:  The blood pressure is at target.  No change in thearpy.    SLEEP APNEA:   He has had surgical repair.   CKD II:  I will check a BMET.     WPW:    He had ablation.      Follow up me in one year.   Signed, Rollene Rotunda, MD

## 2022-10-20 ENCOUNTER — Encounter: Payer: Self-pay | Admitting: Cardiology

## 2022-10-20 ENCOUNTER — Ambulatory Visit: Payer: Self-pay | Attending: Cardiology | Admitting: Cardiology

## 2022-10-20 VITALS — BP 120/80 | HR 115 | Ht 72.0 in | Wt 397.4 lb

## 2022-10-20 DIAGNOSIS — I10 Essential (primary) hypertension: Secondary | ICD-10-CM

## 2022-10-20 DIAGNOSIS — I482 Chronic atrial fibrillation, unspecified: Secondary | ICD-10-CM

## 2022-10-20 LAB — CBC

## 2022-10-20 LAB — BASIC METABOLIC PANEL
BUN/Creatinine Ratio: 14 (ref 10–24)
Calcium: 9.7 mg/dL (ref 8.6–10.2)
Chloride: 101 mmol/L (ref 96–106)

## 2022-10-20 NOTE — Patient Instructions (Signed)
   Follow-Up: At [facility], you and your health needs are our priority.  As part of our continuing mission to provide you with exceptional heart care, we have created designated Provider Care Teams.  These Care Teams include your primary Cardiologist (physician) and Advanced Practice Providers (APPs -  Physician Assistants and Nurse Practitioners) who all work together to provide you with the care you need, when you need it.  We recommend signing up for the patient portal called "MyChart".  Sign up information is provided on this After Visit Summary.  MyChart is used to connect with patients for Virtual Visits (Telemedicine).  Patients are able to view lab/test results, encounter notes, upcoming appointments, etc.  Non-urgent messages can be sent to your provider as well.   To learn more about what you can do with MyChart, go to [email or web address].    Your next appointment:   12 month(s)  Provider:   James Hochrein, MD      

## 2022-10-21 LAB — BASIC METABOLIC PANEL
BUN: 21 mg/dL (ref 8–27)
CO2: 24 mmol/L (ref 20–29)
Creatinine, Ser: 1.5 mg/dL — ABNORMAL HIGH (ref 0.76–1.27)
Glucose: 137 mg/dL — ABNORMAL HIGH (ref 70–99)
Potassium: 4.5 mmol/L (ref 3.5–5.2)
Sodium: 140 mmol/L (ref 134–144)
eGFR: 52 mL/min/{1.73_m2} — ABNORMAL LOW (ref >59)

## 2022-10-21 LAB — CBC
MCH: 28.4 pg (ref 26.6–33.0)
MCHC: 32.6 g/dL (ref 31.5–35.7)
MCV: 87 fL (ref 79–97)
Platelets: 235 10*3/uL (ref 150–450)
RBC: 4.76 x10E6/uL (ref 4.14–5.80)
RDW: 14.5 % (ref 11.6–15.4)
WBC: 12 10*3/uL — ABNORMAL HIGH (ref 3.4–10.8)

## 2022-10-22 ENCOUNTER — Encounter: Payer: Self-pay | Admitting: *Deleted

## 2022-10-25 ENCOUNTER — Telehealth: Payer: Self-pay | Admitting: Licensed Clinical Social Worker

## 2022-10-25 NOTE — Telephone Encounter (Signed)
H&V Care Navigation CSW Progress Note  Clinical Social Worker contacted patient by phone to f/u after appt with Dr. Antoine Poche as pt is currently uninsured. Reached pt at [phone], introduced self, role, reason for call. Was unable to complete detailed assessment but pt was agreeable to me sending him a CAFA application, he is not interested in primary care resources. Will mail this along with my card should pt be interested in any additional assistance.   Patient is participating in a Managed Medicaid Plan:  No, self pay only  SDOH Screenings   Tobacco Use: Medium Risk (10/20/2022)   Sean [name], MSW, LCSW Clinical Social Worker [facility] Heart/Vascular Care Navigation  [phone]- work cell phone (preferred) [phone]- desk phone

## 2023-07-13 ENCOUNTER — Other Ambulatory Visit: Payer: Self-pay | Admitting: Cardiology

## 2023-07-13 DIAGNOSIS — I480 Paroxysmal atrial fibrillation: Secondary | ICD-10-CM

## 2023-07-14 ENCOUNTER — Other Ambulatory Visit: Payer: Self-pay

## 2023-07-14 DIAGNOSIS — I480 Paroxysmal atrial fibrillation: Secondary | ICD-10-CM

## 2023-07-14 MED ORDER — DILTIAZEM HCL ER BEADS 120 MG PO CP24: 120.0000 mg | ORAL_CAPSULE | Freq: Four times a day (QID) | ORAL | 1 refills | Status: AC
